# Patient Record
Sex: Male | Born: 1955 | Race: White | Hispanic: No | Marital: Married | Smoking: Never smoker
Health system: Southern US, Community
[De-identification: ages and names within clinical notes are randomized; demographics above are authoritative.]

## PROBLEM LIST (undated history)

## (undated) DIAGNOSIS — N400 Enlarged prostate without lower urinary tract symptoms: Secondary | ICD-10-CM

## (undated) DIAGNOSIS — E7800 Pure hypercholesterolemia, unspecified: Secondary | ICD-10-CM

## (undated) DIAGNOSIS — D580 Hereditary spherocytosis: Secondary | ICD-10-CM

## (undated) DIAGNOSIS — C4491 Basal cell carcinoma of skin, unspecified: Secondary | ICD-10-CM

## (undated) DIAGNOSIS — Z889 Allergy status to unspecified drugs, medicaments and biological substances status: Secondary | ICD-10-CM

## (undated) DIAGNOSIS — Z9081 Acquired absence of spleen: Secondary | ICD-10-CM

## (undated) DIAGNOSIS — I2699 Other pulmonary embolism without acute cor pulmonale: Secondary | ICD-10-CM

## (undated) HISTORY — PX: SPLENECTOMY: SUR1306

## (undated) HISTORY — PX: TONSILLECTOMY: SUR1361

## (undated) HISTORY — PX: MASTECTOMY: SHX3

---

## 1988-02-02 HISTORY — PX: APPENDECTOMY: SHX54

## 2007-10-14 HISTORY — PX: COLONOSCOPY: SHX174

## 2008-12-04 DIAGNOSIS — I2699 Other pulmonary embolism without acute cor pulmonale: Secondary | ICD-10-CM

## 2008-12-04 HISTORY — DX: Other pulmonary embolism without acute cor pulmonale: I26.99

## 2016-03-24 DIAGNOSIS — E7800 Pure hypercholesterolemia, unspecified: Secondary | ICD-10-CM | POA: Insufficient documentation

## 2016-03-24 DIAGNOSIS — Z86711 Personal history of pulmonary embolism: Secondary | ICD-10-CM | POA: Insufficient documentation

## 2016-03-26 DIAGNOSIS — D580 Hereditary spherocytosis: Secondary | ICD-10-CM | POA: Insufficient documentation

## 2016-03-26 DIAGNOSIS — Z9229 Personal history of other drug therapy: Secondary | ICD-10-CM | POA: Insufficient documentation

## 2016-03-26 DIAGNOSIS — K635 Polyp of colon: Secondary | ICD-10-CM | POA: Insufficient documentation

## 2017-06-12 ENCOUNTER — Encounter: Payer: Self-pay | Admitting: Emergency Medicine

## 2017-06-12 DIAGNOSIS — Y939 Activity, unspecified: Secondary | ICD-10-CM | POA: Insufficient documentation

## 2017-06-12 DIAGNOSIS — M7012 Bursitis, left hand: Secondary | ICD-10-CM | POA: Insufficient documentation

## 2017-06-12 DIAGNOSIS — M25532 Pain in left wrist: Secondary | ICD-10-CM | POA: Diagnosis present

## 2017-06-12 DIAGNOSIS — Z7901 Long term (current) use of anticoagulants: Secondary | ICD-10-CM | POA: Diagnosis not present

## 2017-06-12 DIAGNOSIS — Z791 Long term (current) use of non-steroidal anti-inflammatories (NSAID): Secondary | ICD-10-CM | POA: Diagnosis not present

## 2017-06-12 DIAGNOSIS — F17210 Nicotine dependence, cigarettes, uncomplicated: Secondary | ICD-10-CM | POA: Diagnosis not present

## 2017-06-12 DIAGNOSIS — Z79891 Long term (current) use of opiate analgesic: Secondary | ICD-10-CM | POA: Diagnosis not present

## 2017-06-12 NOTE — ED Triage Notes (Signed)
Pt arrived with complaints of left wrist pain that began suddenly at 1630. Pt reports no known injury and states the pain feels like "its broken." No visible deformity to arm and pt is able to move hand and fingers. Pt reports increased pain with movement. Pulses are present and equal.

## 2017-06-13 ENCOUNTER — Emergency Department
Admission: EM | Admit: 2017-06-13 | Discharge: 2017-06-13 | Disposition: A | Discharge status: Home / Self Care | Attending: Emergency Medicine | Admitting: Emergency Medicine

## 2017-06-13 ENCOUNTER — Emergency Department

## 2017-06-13 DIAGNOSIS — M25532 Pain in left wrist: Secondary | ICD-10-CM

## 2017-06-13 DIAGNOSIS — M7012 Bursitis, left hand: Secondary | ICD-10-CM

## 2017-06-13 HISTORY — DX: Other pulmonary embolism without acute cor pulmonale: I26.99

## 2017-06-13 MED ORDER — HYDROCODONE-ACETAMINOPHEN 5-325 MG PO TABS: 1.0000 | ORAL_TABLET | Freq: Four times a day (QID) | ORAL | 0 refills | Status: DC | PRN

## 2017-06-13 MED ORDER — IBUPROFEN 600 MG PO TABS: 600.0000 mg | ORAL_TABLET | Freq: Two times a day (BID) | ORAL | 0 refills | Status: DC | PRN

## 2017-06-13 MED ORDER — KETOROLAC TROMETHAMINE 30 MG/ML IJ SOLN
30.0000 mg | Freq: Once | INTRAMUSCULAR | Status: AC
  Administered 2017-06-13: 30 mg via INTRAMUSCULAR
  Filled 2017-06-13: qty 1

## 2017-06-13 NOTE — Discharge Instructions (Signed)
1. You may take ibuprofen sparingly, twice daily as needed for 3 days. Take Norco as needed for more severe pain. 2. Continue ice packs several times daily. 3. You may remove splint and sling as needed. 4. Return to the ER for worsening symptoms, increased swelling, numbness/tingling, fever, redness or other concerns.

## 2017-06-13 NOTE — ED Notes (Signed)
Pt states recently more active with upper body, denies injury, reports increased tenderness to left wrist

## 2017-06-13 NOTE — ED Provider Notes (Signed)
[facility] Emergency Department Provider Note   ____________________________________________   First MD Initiated Contact with Patient 06/13/17 0143     (approximate)  I have reviewed the triage vital signs and the nursing notes.   HISTORY  Chief Complaint Wrist Pain    HPI Sean Harvey is a 61 y.o. male who presents to the ED from home with a chief complaint of nontraumatic left wrist pain. Patient is left-handed and noted pain to his left wrist at approximately 4:30 PM. Patient reports he is quite active with biking, kayaking and canoeing. Wifestates he has been overexerting himself recently with leisurely hobbies as well as at work. Patient reports pain to ulnar styloid which is exacerbated with movements. Denies history of gout. Denies fever, chills, chest pain, shortness of breath, abdominal pain, nausea, vomiting. Denies extremity weakness, numbness or tingling. Denies recent travel or trauma. Patient takes Xarelto for PE.   Past Medical History:  Diagnosis Date  . Pulmonary emboli ([place]) 2010    There are no active problems to display for this patient.   Past Surgical History:  Procedure Laterality Date  . MASTECTOMY    . SPLENECTOMY      Prior to Admission medications   Medication Sig Start Date End Date Taking? Authorizing Provider  HYDROcodone-acetaminophen (NORCO) 5-325 MG tablet Take 1 tablet by mouth every 6 (six) hours as needed for moderate pain. 06/13/17   Paulette Blanch, MD  ibuprofen (ADVIL,MOTRIN) 600 MG tablet Take 1 tablet (600 mg total) by mouth 2 (two) times daily as needed. 06/13/17   Paulette Blanch, MD    Allergies Patient has no allergy information on record.  No family history on file.  Social History Social History  Substance Use Topics  . Smoking status: Current Every Day Smoker  . Smokeless tobacco: Former Systems developer    Types: Chew  . Alcohol use Yes    Review of Systems  Constitutional: No fever/chills. Eyes:  No visual changes. ENT: No sore throat. Cardiovascular: Denies chest pain. Respiratory: Denies shortness of breath. Gastrointestinal: No abdominal pain.  No nausea, no vomiting.  No diarrhea.  No constipation. Genitourinary: Negative for dysuria. Musculoskeletal: Positive for left wrist pain. Negative for back pain. Skin: Negative for rash. Neurological: Negative for headaches, focal weakness or numbness.   ____________________________________________   PHYSICAL EXAM:  VITAL SIGNS: ED Triage Vitals  Enc Vitals Group     BP 06/12/17 2346 (!) 145/72     Pulse Rate 06/12/17 2346 (!) 56     Resp 06/12/17 2346 16     Temp 06/12/17 2346 98.5 F (36.9 C)     Temp Source 06/12/17 2346 Oral     SpO2 06/12/17 2346 100 %     Weight 06/12/17 2347 130 lb (59 kg)     Height 06/12/17 2347 5\' 6"  (1.676 m)     Head Circumference --      Peak Flow --      Pain Score 06/12/17 2346 10     Pain Loc --      Pain Edu? --      Excl. in [place]? --     Constitutional: Alert and oriented. Well appearing and in no acute distress. Eyes: Conjunctivae are normal. PERRL. EOMI. Head: Atraumatic. Nose: No congestion/rhinnorhea. Mouth/Throat: Mucous membranes are moist.  Oropharynx non-erythematous. Neck: No stridor.  No cervical spine tenderness to palpation. Cardiovascular: Normal rate, regular rhythm. Grossly normal heart sounds.  Good peripheral circulation. Respiratory: Normal respiratory effort.  No retractions. Lungs CTAB. Gastrointestinal: Soft and nontender. No distention. No abdominal bruits. No CVA tenderness. Musculoskeletal:  Left wrist: No obvious deformity or injury. Tender to palpation along the ulnar styloid with mild swelling. There is no surrounding warmth or erythema. Full range of motion with minimal discomfort. Negative Tinel's. 2+ radial pulses. Brisk, less than 5 second capillary refill. Ring on fourth finger which is loose. Neurologic:  Normal speech and language. No gross focal  neurologic deficits are appreciated. No gait instability. Skin:  Skin is warm, dry and intact. No rash noted. Psychiatric: Mood and affect are normal. Speech and behavior are normal.  ____________________________________________   LABS (all labs ordered are listed, but only abnormal results are displayed)  Labs Reviewed - No data to display ____________________________________________  EKG  None ____________________________________________  RADIOLOGY  Dg Wrist Complete Left  Result Date: 06/13/2017 CLINICAL DATA:  61 year old male with left wrist pain. EXAM: LEFT WRIST - COMPLETE 3+ VIEW COMPARISON:  None. FINDINGS: There is no evidence of fracture or dislocation. There is no evidence of arthropathy or other focal bone abnormality. Soft tissues are unremarkable. IMPRESSION: Negative. Electronically Signed   By: Anner Crete M.D.   On: 06/13/2017 00:29    ____________________________________________   PROCEDURES  Procedure(s) performed: None  Procedures  Critical Care performed: No  ____________________________________________   INITIAL IMPRESSION / ASSESSMENT AND PLAN / ED COURSE  Pertinent labs & imaging results that were available during my care of the patient were reviewed by me and considered in my medical decision making (see chart for details).  61 year old male who presents with nontraumatic left wrist pain. Most likely bursitis secondary to overuse. There is no surrounding warmth or erythema to suggest gout or septic joint. Applying ice alone and has alleviated most of patient's pain. I advised NSAID sparingly since patient is on Xarelto. Prescription for Norco to use as needed for or severe pain. Velcro wrist splint and sling provided and patient will follow-up with orthopedics as needed. Strict return precautions given. Patient and spouse verbalize understanding and agree with plan of care.      ____________________________________________   FINAL  CLINICAL IMPRESSION(S) / ED DIAGNOSES  Final diagnoses:  Acute pain of left wrist  Bursitis of left wrist      NEW MEDICATIONS STARTED DURING THIS VISIT:  Discharge Medication List as of 06/13/2017  2:03 AM    START taking these medications   Details  HYDROcodone-acetaminophen (NORCO) 5-325 MG tablet Take 1 tablet by mouth every 6 (six) hours as needed for moderate pain., Starting Wed 06/13/2017, Print    ibuprofen (ADVIL,MOTRIN) 600 MG tablet Take 1 tablet (600 mg total) by mouth 2 (two) times daily as needed., Starting Wed 06/13/2017, Print         Note:  This document was prepared using Dragon voice recognition software and may include unintentional dictation errors.    Paulette Blanch, MD 06/13/17 [phone]

## 2017-06-13 NOTE — ED Notes (Signed)

## 2018-09-30 ENCOUNTER — Encounter: Payer: Self-pay | Admitting: *Deleted

## 2018-10-01 ENCOUNTER — Encounter: Payer: Self-pay | Admitting: *Deleted

## 2018-10-01 ENCOUNTER — Ambulatory Visit
Admission: RE | Admit: 2018-10-01 | Discharge: 2018-10-01 | Disposition: A | Discharge status: Home / Self Care | Source: Ambulatory Visit | Attending: Internal Medicine | Admitting: Internal Medicine

## 2018-10-01 ENCOUNTER — Ambulatory Visit: Admitting: Anesthesiology

## 2018-10-01 ENCOUNTER — Encounter
Admission: RE | Disposition: A | Discharge status: Home / Self Care | Payer: Self-pay | Source: Ambulatory Visit | Attending: Internal Medicine

## 2018-10-01 DIAGNOSIS — N400 Enlarged prostate without lower urinary tract symptoms: Secondary | ICD-10-CM | POA: Insufficient documentation

## 2018-10-01 DIAGNOSIS — Z7901 Long term (current) use of anticoagulants: Secondary | ICD-10-CM | POA: Diagnosis not present

## 2018-10-01 DIAGNOSIS — Z86010 Personal history of colonic polyps: Secondary | ICD-10-CM | POA: Diagnosis not present

## 2018-10-01 DIAGNOSIS — Z1211 Encounter for screening for malignant neoplasm of colon: Secondary | ICD-10-CM | POA: Diagnosis present

## 2018-10-01 DIAGNOSIS — K640 First degree hemorrhoids: Secondary | ICD-10-CM | POA: Diagnosis not present

## 2018-10-01 DIAGNOSIS — Z79899 Other long term (current) drug therapy: Secondary | ICD-10-CM | POA: Diagnosis not present

## 2018-10-01 DIAGNOSIS — Z800 Family history of malignant neoplasm of digestive organs: Secondary | ICD-10-CM | POA: Diagnosis not present

## 2018-10-01 DIAGNOSIS — Z86711 Personal history of pulmonary embolism: Secondary | ICD-10-CM | POA: Diagnosis not present

## 2018-10-01 DIAGNOSIS — Z85828 Personal history of other malignant neoplasm of skin: Secondary | ICD-10-CM | POA: Insufficient documentation

## 2018-10-01 DIAGNOSIS — R1312 Dysphagia, oropharyngeal phase: Secondary | ICD-10-CM | POA: Insufficient documentation

## 2018-10-01 DIAGNOSIS — K5730 Diverticulosis of large intestine without perforation or abscess without bleeding: Secondary | ICD-10-CM | POA: Diagnosis not present

## 2018-10-01 DIAGNOSIS — E7800 Pure hypercholesterolemia, unspecified: Secondary | ICD-10-CM | POA: Diagnosis not present

## 2018-10-01 HISTORY — DX: Allergy status to unspecified drugs, medicaments and biological substances: Z88.9

## 2018-10-01 HISTORY — DX: Acquired absence of spleen: Z90.81

## 2018-10-01 HISTORY — PX: ESOPHAGOGASTRODUODENOSCOPY (EGD) WITH PROPOFOL: SHX5813

## 2018-10-01 HISTORY — DX: Pure hypercholesterolemia, unspecified: E78.00

## 2018-10-01 HISTORY — DX: Hereditary spherocytosis: D58.0

## 2018-10-01 HISTORY — DX: Benign prostatic hyperplasia without lower urinary tract symptoms: N40.0

## 2018-10-01 HISTORY — DX: Basal cell carcinoma of skin, unspecified: C44.91

## 2018-10-01 HISTORY — PX: COLONOSCOPY WITH PROPOFOL: SHX5780

## 2018-10-01 SURGERY — COLONOSCOPY WITH PROPOFOL
Anesthesia: General

## 2018-10-01 MED ORDER — PHENYLEPHRINE HCL 10 MG/ML IJ SOLN
INTRAMUSCULAR | Status: DC | PRN
  Administered 2018-10-01: 100 ug via INTRAVENOUS

## 2018-10-01 MED ORDER — PROPOFOL 10 MG/ML IV BOLUS
INTRAVENOUS | Status: AC
  Filled 2018-10-01: qty 20

## 2018-10-01 MED ORDER — GLYCOPYRROLATE 0.2 MG/ML IJ SOLN
INTRAMUSCULAR | Status: AC
  Filled 2018-10-01: qty 1

## 2018-10-01 MED ORDER — FENTANYL CITRATE (PF) 100 MCG/2ML IJ SOLN
INTRAMUSCULAR | Status: AC
  Filled 2018-10-01: qty 2

## 2018-10-01 MED ORDER — PROPOFOL 10 MG/ML IV BOLUS
INTRAVENOUS | Status: DC | PRN
  Administered 2018-10-01 (×2): 40 mg via INTRAVENOUS

## 2018-10-01 MED ORDER — PROPOFOL 500 MG/50ML IV EMUL
INTRAVENOUS | Status: AC
  Filled 2018-10-01: qty 50

## 2018-10-01 MED ORDER — FENTANYL CITRATE (PF) 100 MCG/2ML IJ SOLN
INTRAMUSCULAR | Status: DC | PRN
  Administered 2018-10-01: 50 ug via INTRAVENOUS

## 2018-10-01 MED ORDER — MIDAZOLAM HCL 2 MG/2ML IJ SOLN
INTRAMUSCULAR | Status: AC
  Filled 2018-10-01: qty 2

## 2018-10-01 MED ORDER — GLYCOPYRROLATE 0.2 MG/ML IJ SOLN
INTRAMUSCULAR | Status: DC | PRN
  Administered 2018-10-01: 0.2 mg via INTRAVENOUS

## 2018-10-01 MED ORDER — IPRATROPIUM-ALBUTEROL 0.5-2.5 (3) MG/3ML IN SOLN: 3.0000 mL | Freq: Once | RESPIRATORY_TRACT | Status: DC

## 2018-10-01 MED ORDER — PROPOFOL 500 MG/50ML IV EMUL
INTRAVENOUS | Status: DC | PRN
  Administered 2018-10-01: 140 ug/kg/min via INTRAVENOUS

## 2018-10-01 MED ORDER — SODIUM CHLORIDE 0.9 % IV SOLN
INTRAVENOUS | Status: DC
  Administered 2018-10-01: 12:00:00 via INTRAVENOUS

## 2018-10-01 MED ORDER — MIDAZOLAM HCL 2 MG/2ML IJ SOLN
INTRAMUSCULAR | Status: DC | PRN
  Administered 2018-10-01: 1 mg via INTRAVENOUS

## 2018-10-01 MED ORDER — EPHEDRINE SULFATE 50 MG/ML IJ SOLN
INTRAMUSCULAR | Status: DC | PRN
  Administered 2018-10-01: 10 mg via INTRAVENOUS

## 2018-10-01 NOTE — Anesthesia Preprocedure Evaluation (Signed)
Anesthesia Evaluation  Patient identified by MRN, date of birth, ID band Patient awake    Reviewed: Allergy & Precautions, H&P , NPO status , Patient's Chart, lab work & pertinent test results  History of Anesthesia Complications Negative for: history of anesthetic complications  Airway Mallampati: II  TM Distance: >3 FB Neck ROM: full    Dental  (+) Chipped   Pulmonary neg pulmonary ROS, neg shortness of breath,           Cardiovascular Exercise Tolerance: Good (-) angina(-) Past MI and (-) DOE negative cardio ROS       Neuro/Psych negative neurological ROS  negative psych ROS   GI/Hepatic negative GI ROS, Neg liver ROS,   Endo/Other  negative endocrine ROS  Renal/GU negative Renal ROS  negative genitourinary   Musculoskeletal   Abdominal   Peds  Hematology negative hematology ROS (+)   Anesthesia Other Findings Past Medical History: No date: Acquired asplenia No date: Allergic genetic state No date: Basal cell carcinoma No date: BPH (benign prostatic hyperplasia) No date: Hypercholesterolemia 2010: Pulmonary emboli ([facility]) No date: Spherocytosis, hereditary [facility])  Past Surgical History: 02/1988: APPENDECTOMY 10/14/2007: COLONOSCOPY No date: MASTECTOMY No date: SPLENECTOMY No date: TONSILLECTOMY  BMI    Body Mass Index:  20.98 kg/m      Reproductive/Obstetrics negative OB ROS                             Anesthesia Physical Anesthesia Plan  ASA: II  Anesthesia Plan: General   Post-op Pain Management:    Induction: Intravenous  PONV Risk Score and Plan: Propofol infusion and TIVA  Airway Management Planned: Natural Airway and Nasal Cannula  Additional Equipment:   Intra-op Plan:   Post-operative Plan:   Informed Consent: I have reviewed the patients History and Physical, chart, labs and discussed the procedure including the risks, benefits and alternatives  for the proposed anesthesia with the patient or authorized representative who has indicated his/her understanding and acceptance.   Dental Advisory Given  Plan Discussed with: Anesthesiologist, CRNA and Surgeon  Anesthesia Plan Comments: (Patient consented for risks of anesthesia including but not limited to:  - adverse reactions to medications - risk of intubation if required - damage to teeth, lips or other oral mucosa - sore throat or hoarseness - Damage to heart, brain, lungs or loss of life  Patient voiced understanding.)        Anesthesia Quick Evaluation

## 2018-10-01 NOTE — Transfer of Care (Signed)
Immediate Anesthesia Transfer of Care Note  Patient: Sean Harvey  Procedure(s) Performed: COLONOSCOPY WITH PROPOFOL (N/A ) ESOPHAGOGASTRODUODENOSCOPY (EGD) WITH PROPOFOL (N/A )  Patient Location: PACU  Anesthesia Type:General  Level of Consciousness: sedated  Airway & Oxygen Therapy: Patient Spontanous Breathing and Patient connected to nasal cannula oxygen  Post-op Assessment: Report given to RN and Post -op Vital signs reviewed and stable  Post vital signs: Reviewed and stable  Last Vitals:  Vitals Value Taken Time  BP 87/56 10/01/2018  1:06 PM  Temp 36.1 C 10/01/2018  1:05 PM  Pulse 71 10/01/2018  1:07 PM  Resp 9 10/01/2018  1:07 PM  SpO2 100 % 10/01/2018  1:07 PM  Vitals shown include unvalidated device data.  Last Pain:  Vitals:   10/01/18 1305  TempSrc: Tympanic  PainSc: 0-No pain         Complications: No apparent anesthesia complications

## 2018-10-01 NOTE — Anesthesia Postprocedure Evaluation (Signed)
Anesthesia Post Note  Patient: Sean Harvey  Procedure(s) Performed: COLONOSCOPY WITH PROPOFOL (N/A ) ESOPHAGOGASTRODUODENOSCOPY (EGD) WITH PROPOFOL (N/A )  Patient location during evaluation: Endoscopy Anesthesia Type: General Level of consciousness: awake and alert Pain management: pain level controlled Vital Signs Assessment: post-procedure vital signs reviewed and stable Respiratory status: spontaneous breathing, nonlabored ventilation, respiratory function stable and patient connected to nasal cannula oxygen Cardiovascular status: blood pressure returned to baseline and stable Postop Assessment: no apparent nausea or vomiting Anesthetic complications: no     Last Vitals:  Vitals:   10/01/18 1305 10/01/18 1309  BP:  (!) 94/55  Pulse: 74   Resp: 16   Temp: (!) 36.1 C   SpO2: 100%     Last Pain:  Vitals:   10/01/18 1347  TempSrc:   PainSc: 0-No pain                 Precious Haws Reubin Bushnell

## 2018-10-01 NOTE — H&P (Signed)
Outpatient short stay form Pre-procedure 10/01/2018 12:11 PM Sean Harvey. Sean Harvey, M.D.  Primary Physician: Harrel Lemon, M.D.  Reason for visit:  Dysphagia, personal hx of colon polyps, family history of colon cancer.  History of present illness:  62 y/o male presents for one episode of oropharyngeal dysphagia while eating bread. Has not happened since. Also has personal hx of adenomatous colon polyps in 2008 and 2014 colonoscopies. Patient denies change in bowel habits, rectal bleeding, weight loss or abdominal pain. r Current Facility-Administered Medications:  .  0.9 %  sodium chloride infusion, , Intravenous, Continuous, Vernal Hritz, Waldron K, MD .  ipratropium-albuterol (DUONEB) 0.5-2.5 (3) MG/3ML nebulizer solution 3 mL, 3 mL, Nebulization, Once, Piscitello, Precious Haws, MD  Medications Prior to Admission  Medication Sig Dispense Refill Last Dose  . HYDROcodone-acetaminophen (NORCO) 5-325 MG tablet Take 1 tablet by mouth every 6 (six) hours as needed for moderate pain. 15 tablet 0 09/30/2018 at Unknown time  . hydrocortisone (ANUSOL-HC) 2.5 % rectal cream Place 1 application rectally 3 (three) times daily.   Past Week at Unknown time  . pravastatin (PRAVACHOL) 40 MG tablet Take 40 mg by mouth daily.   09/30/2018 at Unknown time  . rivaroxaban (XARELTO) 20 MG TABS tablet Take 20 mg by mouth daily.   Past Week at Unknown time  . VITAMINS A C E-ZN PO Take by mouth daily.   Past Week at Unknown time  . ibuprofen (ADVIL,MOTRIN) 600 MG tablet Take 1 tablet (600 mg total) by mouth 2 (two) times daily as needed. 8 tablet 0  at prn  . tamsulosin (FLOMAX) 0.4 MG CAPS capsule Take 0.4 mg by mouth daily.   Not Taking at Unknown time     Not on File   Past Medical History:  Diagnosis Date  . Acquired asplenia   . Allergic genetic state   . Basal cell carcinoma   . BPH (benign prostatic hyperplasia)   . Hypercholesterolemia   . Pulmonary emboli ([place]) 2010  . Spherocytosis, hereditary ([place])      Review of systems:  Otherwise negative.    Physical Exam  Gen: Alert, oriented. Appears stated age.  HEENT: [place]/AT. PERRLA. Lungs: CTA, no wheezes. CV: RR nl S1, S2. Abd: soft, benign, no masses. BS+ Ext: No edema. Pulses 2+    Planned procedures: Proceed with EGD and  colonoscopy. The patient understands the nature of the planned procedure, indications, risks, alternatives and potential complications including but not limited to bleeding, infection, perforation, damage to internal organs and possible oversedation/side effects from anesthesia. The patient agrees and gives consent to proceed.  Please refer to procedure notes for findings, recommendations and patient disposition/instructions.     Sean Harvey. Sean Harvey, M.D. Gastroenterology 10/01/2018  12:11 PM

## 2018-10-01 NOTE — Op Note (Addendum)
[facility] Gastroenterology Patient Name: Sean Harvey Procedure Date: 10/01/2018 11:52 AM MRN: [id number] Account #: [id number] Date of Birth: 24-Sep-1956 Admit Type: Outpatient Age: 62 Room: [facility] ENDO ROOM 3 Gender: Male Note Status: Finalized Procedure:            Colonoscopy Indications:          Screening in patient at increased risk: Family history                        of 1st-degree relative with colorectal cancer, High                        risk colon cancer surveillance: Personal history of                        colonic polyps Providers:            Benay Pike. Kaitlyn Skowron MD, MD Medicines:            Propofol per Anesthesia Complications:        No immediate complications. Procedure:            Pre-Anesthesia Assessment:                       - The risks and benefits of the procedure and the                        sedation options and risks were discussed with the                        patient. All questions were answered and informed                        consent was obtained.                       - Patient identification and proposed procedure were                        verified prior to the procedure by the nurse. The                        procedure was verified in the procedure room.                       - ASA Grade Assessment: III - A patient with severe                        systemic disease.                       - After reviewing the risks and benefits, the patient                        was deemed in satisfactory condition to undergo the                        procedure.                       After obtaining informed consent, the colonoscope was  passed under direct vision. Throughout the procedure,                        the patient's blood pressure, pulse, and oxygen                        saturations were monitored continuously. The                        Colonoscope was introduced through the anus and               advanced to the the cecum, identified by appendiceal                        orifice and ileocecal valve. The colonoscopy was                        performed without difficulty. The patient tolerated the                        procedure well. The quality of the bowel preparation                        was excellent. Findings:      The perianal and digital rectal examinations were normal. Pertinent       negatives include normal sphincter tone and no palpable rectal lesions.      A few small-mouthed diverticula were found in the sigmoid colon.      Non-bleeding internal hemorrhoids were found during retroflexion. The       hemorrhoids were Grade I (internal hemorrhoids that do not prolapse).      The exam was otherwise without abnormality. Impression:           - Diverticulosis in the sigmoid colon.                       - Non-bleeding internal hemorrhoids.                       - The examination was otherwise normal.                       - No specimens collected. Recommendation:       - Patient has a contact number available for                        emergencies. The signs and symptoms of potential                        delayed complications were discussed with the patient.                        Return to normal activities tomorrow. Written discharge                        instructions were provided to the patient.                       - Resume previous diet.                       - Continue  present medications.                       - Repeat colonoscopy in 5 years for screening purposes.                       - Return to my office PRN.                       - The findings and recommendations were discussed with                        the patient and their family. Procedure Code(s):    --- Professional ---                       P5916, Colorectal cancer screening; colonoscopy on                        individual at high risk Diagnosis Code(s):    --- Professional ---                        K57.30, Diverticulosis of large intestine without                        perforation or abscess without bleeding                       K64.0, First degree hemorrhoids                       Z86.010, Personal history of colonic polyps                       Z80.0, Family history of malignant neoplasm of                        digestive organs CPT copyright 2018 American Medical Association. All rights reserved. The codes documented in this report are preliminary and upon coder review may  be revised to meet current compliance requirements. Efrain Sella MD, MD 10/01/2018 1:09:55 PM This report has been signed electronically. Number of Addenda: 0 Note Initiated On: 10/01/2018 11:52 AM Scope Withdrawal Time: 0 hours 5 minutes 59 seconds  Total Procedure Duration: 0 hours 10 minutes 42 seconds       [facility]

## 2018-10-01 NOTE — Anesthesia Post-op Follow-up Note (Signed)
Anesthesia QCDR form completed.        

## 2018-10-01 NOTE — Interval H&P Note (Signed)
History and Physical Interval Note:  10/01/2018 12:14 PM  Sean Harvey  has presented today for surgery, with the diagnosis of personal hx colon polyps pharyngeal dysphagia  The various methods of treatment have been discussed with the patient and family. After consideration of risks, benefits and other options for treatment, the patient has consented to  Procedure(s): COLONOSCOPY WITH PROPOFOL (N/A) ESOPHAGOGASTRODUODENOSCOPY (EGD) WITH PROPOFOL (N/A) as a surgical intervention .  The patient's history has been reviewed, patient examined, no change in status, stable for surgery.  I have reviewed the patient's chart and labs.  Questions were answered to the patient's satisfaction.     [place], [place]

## 2018-10-01 NOTE — Anesthesia Procedure Notes (Signed)
Date/Time: 10/01/2018 12:15 PM Performed by: Allean Found, CRNA Pre-anesthesia Checklist: Patient identified, Emergency Drugs available, Suction available, Patient being monitored and Timeout performed Patient Re-evaluated:Patient Re-evaluated prior to induction Oxygen Delivery Method: Nasal cannula Placement Confirmation: positive ETCO2

## 2018-10-01 NOTE — Op Note (Signed)
[facility] Gastroenterology Patient Name: Sean Harvey Procedure Date: 10/01/2018 11:53 AM MRN: [id number] Account #: [id number] Date of Birth: 1956-11-29 Admit Type: Outpatient Age: 62 Room: [facility] ENDO ROOM 3 Gender: Male Note Status: Finalized Procedure:            Upper GI endoscopy Indications:          Dysphagia Providers:            Benay Pike. Alice Reichert MD, MD Referring MD:         No Local Md, MD (Referring MD) Medicines:            Propofol per Anesthesia Complications:        No immediate complications. Procedure:            Pre-Anesthesia Assessment:                       - Prior to the procedure, a History and Physical was                        performed, and patient medications, allergies and                        sensitivities were reviewed. The patient's tolerance of                        previous anesthesia was reviewed.                       After obtaining informed consent, the endoscope was                        passed under direct vision. Throughout the procedure,                        the patient's blood pressure, pulse, and oxygen                        saturations were monitored continuously. The Endoscope                        was introduced through the mouth, and advanced to the                        third part of duodenum. The upper GI endoscopy was                        accomplished without difficulty. The patient tolerated                        the procedure well. Findings:      The examined esophagus was normal.      The entire examined stomach was normal.      The examined duodenum was normal.      The exam was otherwise without abnormality. Impression:           - Normal esophagus.                       - Normal stomach.                       - Normal examined duodenum.                       -  The examination was otherwise normal.                       - No specimens collected. Recommendation:       - Proceed with  colonoscopy Procedure Code(s):    --- Professional ---                       [phone], Esophagogastroduodenoscopy, flexible, transoral;                        diagnostic, including collection of specimen(s) by                        brushing or washing, when performed (separate procedure) Diagnosis Code(s):    --- Professional ---                       R13.10, Dysphagia, unspecified CPT copyright 2018 American Medical Association. All rights reserved. The codes documented in this report are preliminary and upon coder review may  be revised to meet current compliance requirements. Efrain Sella MD, MD 10/01/2018 12:48:55 PM This report has been signed electronically. Number of Addenda: 0 Note Initiated On: 10/01/2018 11:53 AM      [facility]

## 2018-10-02 ENCOUNTER — Encounter: Payer: Self-pay | Admitting: Internal Medicine

## 2019-09-10 DIAGNOSIS — M1712 Unilateral primary osteoarthritis, left knee: Secondary | ICD-10-CM | POA: Insufficient documentation

## 2019-10-20 ENCOUNTER — Other Ambulatory Visit: Payer: Self-pay | Admitting: Internal Medicine

## 2019-10-20 DIAGNOSIS — R748 Abnormal levels of other serum enzymes: Secondary | ICD-10-CM

## 2019-10-23 ENCOUNTER — Other Ambulatory Visit: Payer: Self-pay

## 2019-10-23 ENCOUNTER — Ambulatory Visit
Admission: RE | Admit: 2019-10-23 | Discharge: 2019-10-23 | Disposition: A | Discharge status: Home / Self Care | Source: Ambulatory Visit | Attending: Internal Medicine | Admitting: Internal Medicine

## 2019-10-23 DIAGNOSIS — R748 Abnormal levels of other serum enzymes: Secondary | ICD-10-CM

## 2020-03-18 ENCOUNTER — Ambulatory Visit (INDEPENDENT_AMBULATORY_CARE_PROVIDER_SITE_OTHER): Admitting: Dermatology

## 2020-03-18 ENCOUNTER — Encounter: Payer: Self-pay | Admitting: Dermatology

## 2020-03-18 ENCOUNTER — Other Ambulatory Visit: Payer: Self-pay

## 2020-03-18 DIAGNOSIS — L578 Other skin changes due to chronic exposure to nonionizing radiation: Secondary | ICD-10-CM

## 2020-03-18 DIAGNOSIS — L821 Other seborrheic keratosis: Secondary | ICD-10-CM | POA: Diagnosis not present

## 2020-03-18 DIAGNOSIS — Z872 Personal history of diseases of the skin and subcutaneous tissue: Secondary | ICD-10-CM | POA: Diagnosis not present

## 2020-03-18 NOTE — Progress Notes (Signed)
   Follow-Up Visit   Subjective  Sean Harvey is a 64 y.o. male who presents for the following: Follow-up (ISKs) and raised spot (Back of left shoulder, has been there for about 2 years, seems to be growing in size).  The following portions of the chart were reviewed this encounter and updated as appropriate: Tobacco  Allergies  Meds  Problems  Med Hx  Surg Hx  Fam Hx      Review of Systems: No other skin or systemic complaints.  Objective  Well appearing patient in no apparent distress; mood and affect are within normal limits.  A focused examination was performed including face, neck, chest and back. Relevant physical exam findings are noted in the Assessment and Plan.   Assessment & Plan    Actinic Damage - diffuse scaly erythematous macules with underlying dyspigmentation - Recommend daily broad spectrum sunscreen SPF 30+ to sun-exposed areas, reapply every 2 hours as needed.  - Call for new or changing lesions.  Seborrheic Keratoses - Stuck-on, waxy, tan-brown papules and plaques scattered at shoulders and scalp - Discussed benign etiology and prognosis. - Observe - Call for any changes  History of irritated seborrheic keratoses - Clear today after LN2 - Call for recurrence.  Return in about 6 months (around 09/17/2020) for FBSE.   I, Donzetta Kohut, CMA, am acting as scribe for Forest Gleason, MD .  Documentation: I have reviewed the above documentation for accuracy and completeness, and I agree with the above.  Forest Gleason, MD

## 2020-03-18 NOTE — Patient Instructions (Signed)
Recommend daily broad spectrum sunscreen SPF 30+ to sun-exposed areas, reapply every 2 hours as needed. Call for new or changing lesions.  Recommend taking Heliocare sun protection supplement daily in sunny weather for additional sun protection. For maximum protection on the sunniest days, you can take up to 2 capsules of regular Heliocare OR take 1 capsule of Heliocare Ultra. For prolonged exposure (such as a full day in the sun), you can repeat your dose of the supplement 4 hours after your first dose. Heliocare can be purchased at [employer], at some [facility] or at [email or web address].

## 2020-04-06 ENCOUNTER — Emergency Department (HOSPITAL_COMMUNITY)
Admission: EM | Admit: 2020-04-06 | Discharge: 2020-04-06 | Disposition: A | Discharge status: Home / Self Care | Attending: Emergency Medicine | Admitting: Emergency Medicine

## 2020-04-06 ENCOUNTER — Encounter (HOSPITAL_COMMUNITY): Payer: Self-pay

## 2020-04-06 ENCOUNTER — Emergency Department (HOSPITAL_COMMUNITY)

## 2020-04-06 ENCOUNTER — Other Ambulatory Visit: Payer: Self-pay

## 2020-04-06 DIAGNOSIS — Z79899 Other long term (current) drug therapy: Secondary | ICD-10-CM | POA: Insufficient documentation

## 2020-04-06 DIAGNOSIS — R791 Abnormal coagulation profile: Secondary | ICD-10-CM | POA: Diagnosis not present

## 2020-04-06 DIAGNOSIS — H538 Other visual disturbances: Secondary | ICD-10-CM

## 2020-04-06 LAB — COMPREHENSIVE METABOLIC PANEL
ALT: 42 U/L (ref 0–44)
AST: 36 U/L (ref 15–41)
Albumin: 3.8 g/dL (ref 3.5–5.0)
Alkaline Phosphatase: 63 U/L (ref 38–126)
Anion gap: 7 (ref 5–15)
BUN: 15 mg/dL (ref 8–23)
CO2: 26 mmol/L (ref 22–32)
Calcium: 9.6 mg/dL (ref 8.9–10.3)
Chloride: 104 mmol/L (ref 98–111)
Creatinine, Ser: 0.98 mg/dL (ref 0.61–1.24)
GFR calc Af Amer: >60 mL/min (ref >60)
GFR calc non Af Amer: >60 mL/min (ref >60)
Glucose, Bld: 103 mg/dL — ABNORMAL HIGH (ref 70–99)
Potassium: 4.3 mmol/L (ref 3.5–5.1)
Sodium: 137 mmol/L (ref 135–145)
Total Bilirubin: 0.7 mg/dL (ref 0.3–1.2)
Total Protein: 7.5 g/dL (ref 6.5–8.1)

## 2020-04-06 LAB — I-STAT CHEM 8, ED
BUN: 18 mg/dL (ref 8–23)
Calcium, Ion: 1.26 mmol/L (ref 1.15–1.40)
Chloride: 102 mmol/L (ref 98–111)
Creatinine, Ser: 0.8 mg/dL (ref 0.61–1.24)
Glucose, Bld: 97 mg/dL (ref 70–99)
HCT: 42 % (ref 39.0–52.0)
Hemoglobin: 14.3 g/dL (ref 13.0–17.0)
Potassium: 4.3 mmol/L (ref 3.5–5.1)
Sodium: 140 mmol/L (ref 135–145)
TCO2: 29 mmol/L (ref 22–32)

## 2020-04-06 LAB — CBC
HCT: 44.3 % (ref 39.0–52.0)
Hemoglobin: 15 g/dL (ref 13.0–17.0)
MCH: 33.2 pg (ref 26.0–34.0)
MCHC: 33.9 g/dL (ref 30.0–36.0)
MCV: 98 fL (ref 80.0–100.0)
Platelets: 390 10*3/uL (ref 150–400)
RBC: 4.52 MIL/uL (ref 4.22–5.81)
RDW: 13.1 % (ref 11.5–15.5)
WBC: 8.8 10*3/uL (ref 4.0–10.5)
nRBC: 0 % (ref 0.0–0.2)

## 2020-04-06 LAB — DIFFERENTIAL
Abs Immature Granulocytes: 0.04 10*3/uL (ref 0.00–0.07)
Basophils Absolute: 0.1 10*3/uL (ref 0.0–0.1)
Basophils Relative: 1 %
Eosinophils Absolute: 0.4 10*3/uL (ref 0.0–0.5)
Eosinophils Relative: 4 %
Immature Granulocytes: 1 %
Lymphocytes Relative: 23 %
Lymphs Abs: 2 10*3/uL (ref 0.7–4.0)
Monocytes Absolute: 1 10*3/uL (ref 0.1–1.0)
Monocytes Relative: 11 %
Neutro Abs: 5.3 10*3/uL (ref 1.7–7.7)
Neutrophils Relative %: 60 %

## 2020-04-06 LAB — APTT: aPTT: 33 seconds (ref 24–36)

## 2020-04-06 LAB — PROTIME-INR
INR: 1.2 (ref 0.8–1.2)
Prothrombin Time: 14.5 seconds (ref 11.4–15.2)

## 2020-04-06 LAB — CBG MONITORING, ED: Glucose-Capillary: 82 mg/dL (ref 70–99)

## 2020-04-06 MED ORDER — IOHEXOL 350 MG/ML SOLN
80.0000 mL | Freq: Once | INTRAVENOUS | Status: AC | PRN
  Administered 2020-04-06: 80 mL via INTRAVENOUS

## 2020-04-06 MED ORDER — SODIUM CHLORIDE 0.9% FLUSH: 3.0000 mL | Freq: Once | INTRAVENOUS | Status: DC

## 2020-04-06 NOTE — ED Provider Notes (Signed)
[facility] EMERGENCY DEPARTMENT Provider Note   CSN: [id number] Arrival date & time: 04/06/20  [id number]     History No chief complaint on file.   Sean Harvey is a 64 y.o. male.  HPI Patient presents with blurred vision.  Has had since Friday being Tuesday.  Initially it came and went but now more constant.  States it is more just trouble reading and vision is blurred.  Also feels little dizzy at times.  Feels little lightheaded.  No headache.  No confusion.  No chest pain.  Has not had episodes like this before.  Not better if either eyes closed.  He has a low heart rate but states he is athletic and rides his bike a lot.  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  No chest pain.    Past Medical History:  Diagnosis Date  . Acquired asplenia   . Allergic genetic state   . Basal cell carcinoma   . BPH (benign prostatic hyperplasia)   . Hypercholesterolemia   . Pulmonary emboli ([place]) 2010  . Spherocytosis, hereditary ([place])     There are no problems to display for this patient.   Past Surgical History:  Procedure Laterality Date  . APPENDECTOMY  02/1988  . COLONOSCOPY  10/14/2007  . COLONOSCOPY WITH PROPOFOL N/A 10/01/2018   Procedure: COLONOSCOPY WITH PROPOFOL;  Surgeon: Toledo, Benay Pike, MD;  Location: [facility] ENDOSCOPY;  Service: Gastroenterology;  Laterality: N/A;  . ESOPHAGOGASTRODUODENOSCOPY (EGD) WITH PROPOFOL N/A 10/01/2018   Procedure: ESOPHAGOGASTRODUODENOSCOPY (EGD) WITH PROPOFOL;  Surgeon: Toledo, Benay Pike, MD;  Location: [facility] ENDOSCOPY;  Service: Gastroenterology;  Laterality: N/A;  . MASTECTOMY    . SPLENECTOMY    . TONSILLECTOMY         Family History  Problem Relation Age of Onset  . Diabetes Mother   . Colon cancer Father   . Diabetes Father   . Prostate cancer Father   . Skin cancer Father     Social History   Tobacco Use  . Smoking status: Never Smoker  . Smokeless tobacco: Never Used   Substance Use Topics  . Alcohol use: Yes  . Drug use: No    Home Medications Prior to Admission medications   Medication Sig Start Date End Date Taking? Authorizing Provider  HYDROcodone-acetaminophen (NORCO) 5-325 MG tablet Take 1 tablet by mouth every 6 (six) hours as needed for moderate pain. 06/13/17   Paulette Blanch, MD  hydrocortisone (ANUSOL-HC) 2.5 % rectal cream Place 1 application rectally 3 (three) times daily.    [provider]  ibuprofen (ADVIL,MOTRIN) 600 MG tablet Take 1 tablet (600 mg total) by mouth 2 (two) times daily as needed. 06/13/17   Paulette Blanch, MD  pravastatin (PRAVACHOL) 40 MG tablet Take 40 mg by mouth daily.    [provider]  rivaroxaban (XARELTO) 20 MG TABS tablet Take 20 mg by mouth daily.    [provider]  tamsulosin (FLOMAX) 0.4 MG CAPS capsule Take 0.4 mg by mouth daily.    [provider]  VITAMINS A C E-ZN PO Take by mouth daily.    [provider]    Allergies    Patient has no known allergies.  Review of Systems   Review of Systems  Constitutional: Negative for appetite change.  HENT: Negative for congestion.   Eyes: Positive for visual disturbance.  Cardiovascular: Negative for chest pain.  Gastrointestinal: Negative for  abdominal distention.  Genitourinary: Negative for flank pain.  Musculoskeletal: Negative for back pain.  Neurological: Positive for dizziness and light-headedness. Negative for speech difficulty and weakness.  Psychiatric/Behavioral: Negative for confusion.    Physical Exam Updated Vital Signs BP 125/77   Pulse 60   Temp 98.3 F (36.8 C) (Oral)   Resp 18   Ht 5\' 6"  (1.676 m)   Wt 63.5 kg   SpO2 100%   BMI 22.60 kg/m   Physical Exam Vitals and nursing note reviewed.  Constitutional:      Appearance: Normal appearance.  HENT:     Head: Atraumatic.  Eyes:     General:        Right eye: No discharge.        Left eye: No discharge.     Extraocular Movements:  Extraocular movements intact.     Pupils: Pupils are equal, round, and reactive to light.  Cardiovascular:     Rate and Rhythm: Regular rhythm.  Musculoskeletal:     Cervical back: Neck supple.  Skin:    General: Skin is warm.     Capillary Refill: Capillary refill takes less than 2 seconds.  Neurological:     Mental Status: He is alert and oriented to person, place, and time.     Comments: Pupils reactive.  Visual fields intact.  Eye movements intact.  Normal speech.  Normal gait.  Normal strength bilateral upper and lower extremities.     ED Results / Procedures / Treatments   Labs (all labs ordered are listed, but only abnormal results are displayed) Labs Reviewed  COMPREHENSIVE METABOLIC PANEL - Abnormal; Notable for the following components:      Result Value   Glucose, Bld 103 (*)    All other components within normal limits  PROTIME-INR  APTT  CBC  DIFFERENTIAL  I-STAT CHEM 8, ED  CBG MONITORING, ED    EKG EKG Interpretation  Date/Time:  Tuesday Apr 06 2020 08:50:37 EDT Ventricular Rate:  51 PR Interval:  160 QRS Duration: 92 QT Interval:  412 QTC Calculation: 379 R Axis:   81 Text Interpretation: Sinus bradycardia Cannot rule out Anterior infarct , age undetermined Abnormal ECG Confirmed by Davonna Belling [phone]) on 04/06/2020 9:02:41 AM   Radiology CT Angio Head W or Wo Contrast  Result Date: 04/06/2020 CLINICAL DATA:  Focal neuro deficit, greater than 6 hours, stroke suspected. Additional history provided: Intermittent dizziness, blurred vision. EXAM: CT ANGIOGRAPHY HEAD AND NECK TECHNIQUE: Multidetector CT imaging of the head and neck was performed using the standard protocol during bolus administration of intravenous contrast. Multiplanar CT image reconstructions and MIPs were obtained to evaluate the vascular anatomy. Carotid stenosis measurements (when applicable) are obtained utilizing NASCET criteria, using the distal internal carotid diameter as the  denominator. CONTRAST:  63mL OMNIPAQUE IOHEXOL 350 MG/ML SOLN COMPARISON:  No pertinent prior studies available for comparison. FINDINGS: CT HEAD FINDINGS Brain: There is no acute intracranial hemorrhage. No demarcated cortical infarct. No extra-axial fluid collection. No evidence of intracranial mass. No midline shift. Vascular: Reported below. Skull: Normal. Negative for fracture or focal lesion. Sinuses: Mild ethmoid sinus mucosal thickening. No significant mastoid effusion. Orbits: No acute abnormality. Review of the MIP images confirms the above findings CTA NECK FINDINGS Aortic arch: Standard aortic branching. Minimal calcified plaque within the proximal right subclavian artery. No hemodynamically significant innominate or proximal subclavian artery stenosis. Right carotid system: CCA and ICA patent within the neck without measurable stenosis. Trace mixed plaque within the  carotid bulb. Left carotid system: CCA and ICA patent within the neck without measurable stenosis. Trace mixed plaque within the carotid bulb. Vertebral arteries: The vertebral arteries are patent within the neck bilaterally without measurable stenosis. The right vertebral artery is dominant. Skeleton: No acute bony abnormality. Cervical spondylosis with multilevel disc space narrowing, posterior disc osteophytes and uncovertebral hypertrophy. Other neck: No neck mass or cervical lymphadenopathy. Upper chest: No consolidation within the imaged lung apices. Review of the MIP images confirms the above findings CTA HEAD FINDINGS Anterior circulation: The intracranial internal carotid arteries are patent. Minimal calcified plaque within these vessels without significant stenosis. The M1 middle cerebral arteries are patent without significant stenosis. No M2 proximal branch occlusion or high-grade proximal stenosis is identified. The anterior cerebral arteries are patent without significant proximal stenosis. No intracranial aneurysm is  identified. Posterior circulation: The dominant intracranial right vertebral artery is patent without significant stenosis. The non dominant intracranial left vertebral artery is developmentally diminutive beyond the origin of the left PICA, although patent. Hypoplastic P1 right PCA segment with sizable right posterior communicating artery. The posterior cerebral is are patent proximally without significant stenosis. The left posterior communicating artery is hypoplastic or absent. Venous sinuses: Within limitations of contrast timing, no convincing thrombus. Anatomic variants: As described Review of the MIP images confirms the above findings IMPRESSION: CT head: Unremarkable CT appearance of the brain. No evidence of acute intracranial abnormality. CTA neck: The bilateral common carotid, internal carotid and vertebral arteries are patent within the neck without significant stenosis. CTA head: 1. No intracranial large vessel occlusion or proximal high-grade arterial stenosis. 2. Mild calcified plaque within the intracranial ICAs. Electronically Signed   By: Kellie Simmering DO   On: 04/06/2020 12:17   CT Angio Neck W and/or Wo Contrast  Result Date: 04/06/2020 CLINICAL DATA:  Focal neuro deficit, greater than 6 hours, stroke suspected. Additional history provided: Intermittent dizziness, blurred vision. EXAM: CT ANGIOGRAPHY HEAD AND NECK TECHNIQUE: Multidetector CT imaging of the head and neck was performed using the standard protocol during bolus administration of intravenous contrast. Multiplanar CT image reconstructions and MIPs were obtained to evaluate the vascular anatomy. Carotid stenosis measurements (when applicable) are obtained utilizing NASCET criteria, using the distal internal carotid diameter as the denominator. CONTRAST:  105mL OMNIPAQUE IOHEXOL 350 MG/ML SOLN COMPARISON:  No pertinent prior studies available for comparison. FINDINGS: CT HEAD FINDINGS Brain: There is no acute intracranial hemorrhage.  No demarcated cortical infarct. No extra-axial fluid collection. No evidence of intracranial mass. No midline shift. Vascular: Reported below. Skull: Normal. Negative for fracture or focal lesion. Sinuses: Mild ethmoid sinus mucosal thickening. No significant mastoid effusion. Orbits: No acute abnormality. Review of the MIP images confirms the above findings CTA NECK FINDINGS Aortic arch: Standard aortic branching. Minimal calcified plaque within the proximal right subclavian artery. No hemodynamically significant innominate or proximal subclavian artery stenosis. Right carotid system: CCA and ICA patent within the neck without measurable stenosis. Trace mixed plaque within the carotid bulb. Left carotid system: CCA and ICA patent within the neck without measurable stenosis. Trace mixed plaque within the carotid bulb. Vertebral arteries: The vertebral arteries are patent within the neck bilaterally without measurable stenosis. The right vertebral artery is dominant. Skeleton: No acute bony abnormality. Cervical spondylosis with multilevel disc space narrowing, posterior disc osteophytes and uncovertebral hypertrophy. Other neck: No neck mass or cervical lymphadenopathy. Upper chest: No consolidation within the imaged lung apices. Review of the MIP images confirms the above findings CTA HEAD  FINDINGS Anterior circulation: The intracranial internal carotid arteries are patent. Minimal calcified plaque within these vessels without significant stenosis. The M1 middle cerebral arteries are patent without significant stenosis. No M2 proximal branch occlusion or high-grade proximal stenosis is identified. The anterior cerebral arteries are patent without significant proximal stenosis. No intracranial aneurysm is identified. Posterior circulation: The dominant intracranial right vertebral artery is patent without significant stenosis. The non dominant intracranial left vertebral artery is developmentally diminutive beyond  the origin of the left PICA, although patent. Hypoplastic P1 right PCA segment with sizable right posterior communicating artery. The posterior cerebral is are patent proximally without significant stenosis. The left posterior communicating artery is hypoplastic or absent. Venous sinuses: Within limitations of contrast timing, no convincing thrombus. Anatomic variants: As described Review of the MIP images confirms the above findings IMPRESSION: CT head: Unremarkable CT appearance of the brain. No evidence of acute intracranial abnormality. CTA neck: The bilateral common carotid, internal carotid and vertebral arteries are patent within the neck without significant stenosis. CTA head: 1. No intracranial large vessel occlusion or proximal high-grade arterial stenosis. 2. Mild calcified plaque within the intracranial ICAs. Electronically Signed   By: Kellie Simmering DO   On: 04/06/2020 12:17    Procedures Procedures (including critical care time)  Medications Ordered in ED Medications  sodium chloride flush (NS) 0.9 % injection 3 mL (has no administration in time range)  iohexol (OMNIPAQUE) 350 MG/ML injection 80 mL (80 mLs Intravenous Contrast Given 04/06/20 1149)    ED Course  I have reviewed the triage vital signs and the nursing notes.  Pertinent labs & imaging results that were available during my care of the patient were reviewed by me and considered in my medical decision making (see chart for details).    MDM Rules/Calculators/A&P                      Patient presents with vision changes and dizziness.  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  Exam overall reassuring here.  Bilateral blurred vision with dizziness..  Head CT and CTA done and reassuring.  Lab work reassuring.  Only abnormality was glucose of 103.  I think stroke less likely.  With reassuring exam I think it is reasonable for outpatient follow-up.  Discharge home Final Clinical Impression(s) / ED Diagnoses Final diagnoses:   Blurred vision, bilateral    Rx / DC Orders ED Discharge Orders    None       Davonna Belling, MD 04/06/20 1314

## 2020-04-06 NOTE — ED Notes (Signed)
Pt d/c home per MD order. Discharge summary reviewed with pt, pt verbalizes understanding,. Off unit via WC. No s/s of acute distress noted. discharged home with significant other

## 2020-04-06 NOTE — Discharge Instructions (Addendum)
With your blurred vision and dizziness follow-up with neurology.  You also may need an eye evaluation.

## 2020-04-06 NOTE — ED Notes (Signed)
Patient transported to CT 

## 2020-04-06 NOTE — ED Triage Notes (Signed)
Patient complains of dizziness that he describes as intermittently with blurred vision that has worsened today, symptoms onset this past Friday, denies pain. Gait steady, no drift

## 2020-08-12 ENCOUNTER — Encounter: Admitting: Dermatology

## 2020-08-12 ENCOUNTER — Ambulatory Visit (INDEPENDENT_AMBULATORY_CARE_PROVIDER_SITE_OTHER): Admitting: Dermatology

## 2020-08-12 ENCOUNTER — Other Ambulatory Visit: Payer: Self-pay

## 2020-08-12 DIAGNOSIS — D229 Melanocytic nevi, unspecified: Secondary | ICD-10-CM

## 2020-08-12 DIAGNOSIS — L578 Other skin changes due to chronic exposure to nonionizing radiation: Secondary | ICD-10-CM

## 2020-08-12 DIAGNOSIS — D485 Neoplasm of uncertain behavior of skin: Secondary | ICD-10-CM | POA: Diagnosis not present

## 2020-08-12 DIAGNOSIS — L820 Inflamed seborrheic keratosis: Secondary | ICD-10-CM

## 2020-08-12 DIAGNOSIS — Z1283 Encounter for screening for malignant neoplasm of skin: Secondary | ICD-10-CM

## 2020-08-12 DIAGNOSIS — B353 Tinea pedis: Secondary | ICD-10-CM | POA: Diagnosis not present

## 2020-08-12 DIAGNOSIS — D1800 Hemangioma unspecified site: Secondary | ICD-10-CM | POA: Diagnosis not present

## 2020-08-12 DIAGNOSIS — L821 Other seborrheic keratosis: Secondary | ICD-10-CM

## 2020-08-12 DIAGNOSIS — Z85828 Personal history of other malignant neoplasm of skin: Secondary | ICD-10-CM

## 2020-08-12 DIAGNOSIS — L814 Other melanin hyperpigmentation: Secondary | ICD-10-CM

## 2020-08-12 HISTORY — DX: Melanocytic nevi, unspecified: D22.9

## 2020-08-12 HISTORY — DX: Neoplasm of uncertain behavior of skin: D48.5

## 2020-08-12 MED ORDER — CICLOPIROX OLAMINE 0.77 % EX CREA: TOPICAL_CREAM | Freq: Two times a day (BID) | CUTANEOUS | 0 refills | Status: AC

## 2020-08-12 NOTE — Patient Instructions (Addendum)
Melanoma ABCDEs  Melanoma is the most dangerous type of skin cancer, and is the leading cause of death from skin disease.  You are more likely to develop melanoma if you:  Have light-colored skin, light-colored eyes, or red or blond hair  Spend a lot of time in the sun  Tan regularly, either outdoors or in a tanning bed  Have had blistering sunburns, especially during childhood  Have a close family member who has had a melanoma  Have atypical moles or large birthmarks  Early detection of melanoma is key since treatment is typically straightforward and cure rates are extremely high if we catch it early.   The first sign of melanoma is often a change in a mole or a new dark spot.  The ABCDE system is a way of remembering the signs of melanoma.  A for asymmetry:  The two halves do not match. B for border:  The edges of the growth are irregular. C for color:  A mixture of colors are present instead of an even brown color. D for diameter:  Melanomas are usually (but not always) greater than 17mm - the size of a pencil eraser. E for evolution:  The spot keeps changing in size, shape, and color.  Please check your skin once per month between visits. You can use a small mirror in front and a large mirror behind you to keep an eye on the back side or your body.   If you see any new or changing lesions before your next follow-up, please call to schedule a visit.  Please continue daily skin protection including broad spectrum sunscreen SPF 30+ to sun-exposed areas, reapplying every 2 hours as needed when you're outdoors.   Wound Care Instructions  1. Cleanse wound gently with soap and water once a day then pat dry with clean gauze. Apply a thing coat of Petrolatum (petroleum jelly, "Vaseline") over the wound (unless you have an allergy to this). We recommend that you use a new, sterile tube of Vaseline. Do not pick or remove scabs. Do not remove the yellow or white "healing tissue" from the  base of the wound.  2. Cover the wound with fresh, clean, nonstick gauze and secure with paper tape. You may use Band-Aids in place of gauze and tape if the would is small enough, but would recommend trimming much of the tape off as there is often too much. Sometimes Band-Aids can irritate the skin.  3. You should call the office for your biopsy report after 1 week if you have not already been contacted.  4. If you experience any problems, such as abnormal amounts of bleeding, swelling, significant bruising, significant pain, or evidence of infection, please call the office immediately.  5. FOR ADULT SURGERY PATIENTS: If you need something for pain relief you may take 1 extra strength Tylenol (acetaminophen) AND 2 Ibuprofen (200mg  each) together every 4 hours as needed for pain. (do not take these if you are allergic to them or if you have a reason you should not take them.) Typically, you may only need pain medication for 1 to 3 days.    Cryotherapy Aftercare  . Wash gently with soap and water everyday.   Marland Kitchen Apply Vaseline and Band-Aid daily until healed.  Prior to procedure, discussed risks of blister formation, small wound, skin dyspigmentation, or rare scar following cryotherapy.   Recommend Nicotinamide 500mg  twice per day to lower risk of non-melanoma skin cancer by approximately 25%.   Recommend taking Heliocare  sun protection supplement daily in sunny weather for additional sun protection. For maximum protection on the sunniest days, you can take up to 2 capsules of regular Heliocare OR take 1 capsule of Heliocare Ultra. For prolonged exposure (such as a full day in the sun), you can repeat your dose of the supplement 4 hours after your first dose. Heliocare can be purchased at [facility] or at [email or web address].

## 2020-08-12 NOTE — Progress Notes (Signed)
en Follow-Up Visit   Subjective  Sean Harvey is a 64 y.o. male who presents for the following: FBSE (Patient here today for 6 month FBSE. He has a history of BCC.).  Patient has a spot on left cheek, present for at least 8 months that he is concerned about. He also has some spots that itch at the upper back. Patient also asked about pads that are used to treat athletes feet.  The following portions of the chart were reviewed this encounter and updated as appropriate:  Tobacco  Allergies  Meds  Problems  Med Hx  Surg Hx  Fam Hx      Review of Systems:  No other skin or systemic complaints except as noted in HPI or Assessment and Plan.  Objective  Well appearing patient in no apparent distress; mood and affect are within normal limits.  A full examination was performed including scalp, head, eyes, ears, nose, lips, neck, chest, axillae, abdomen, back, buttocks, bilateral upper extremities, bilateral lower extremities, hands, feet, fingers, toes, fingernails, and toenails. All findings within normal limits unless otherwise noted below.  Objective  Feet: Scaling and maceration web spaces and over distal and lateral soles.   Objective  Left Cheek: 0.5 cm slightly irregular brown macule      Objective  Left Shoulder x 2 (2): Erythematous keratotic or waxy stuck-on papule or plaque.    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 Currently flared.  Repeat treatment when needed.  ciclopirox (LOPROX) 0.77 % cream - Feet  Neoplasm of uncertain behavior of skin Left Cheek  Epidermal / dermal shaving  Lesion diameter (cm):  0.6 Informed consent: discussed and consent obtained   Timeout: patient name, date of birth, surgical site, and procedure verified   Patient was prepped and draped in usual sterile fashion: area prepped with isopropyl alcohol. Anesthesia: the lesion was anesthetized in a standard fashion   Anesthetic:  1% lidocaine w/  epinephrine 1-100,000 buffered w/ 8.4% NaHCO3 Instrument used: flexible razor blade   Hemostasis achieved with: aluminum chloride   Outcome: patient tolerated procedure well   Post-procedure details: wound care instructions given   Additional details:  Mupirocin and a bandage applied  Specimen 1 - Surgical pathology Differential Diagnosis: r/o SK vs Atypia Check Margins: No 0.4cm slightly irregular brown macule   Inflamed seborrheic keratosis (2) Left Shoulder x 2  Prior to procedure, discussed risks of blister formation, small wound, skin dyspigmentation, or rare scar following cryotherapy.    Destruction of lesion - Left Shoulder x 2 Complexity: simple   Destruction method: cryotherapy   Informed consent: discussed and consent obtained   Lesion destroyed using liquid nitrogen: Yes   Cryotherapy cycles:  2 Outcome: patient tolerated procedure well with no complications   Post-procedure details: wound care instructions given     Lentigines - Scattered tan macules - Discussed due to sun exposure - Benign, observe - Call for any changes  Seborrheic Keratoses - Stuck-on, waxy, tan-brown papules and plaques  - Discussed benign etiology and prognosis. - Observe - Call for any changes  Melanocytic Nevi - Tan-brown and/or pink-flesh-colored symmetric macules and papules - Benign appearing on exam today - Observation - Call clinic for new or changing moles - Recommend daily use of broad spectrum spf 30+ sunscreen to sun-exposed areas.   Hemangiomas - Red papules - Discussed benign nature - Observe - Call for any changes  Actinic Damage - diffuse scaly erythematous macules with underlying  dyspigmentation - Recommend daily broad spectrum sunscreen SPF 30+ to sun-exposed areas, reapply every 2 hours as needed.  - Call for new or changing lesions.  Skin cancer screening performed today.  History of Basal Cell Carcinoma of the Skin - No evidence of recurrence today at  nose 2011 treated with Moh's - Recommend regular full body skin exams - Recommend daily broad spectrum sunscreen SPF 30+ to sun-exposed areas, reapply every 2 hours as needed.  - Call if any new or changing lesions are noted between office visits  Return in about 1 year (around 08/12/2021) for TBSE.  Graciella Belton, RMA, am acting as scribe for Forest Gleason, MD .  Documentation: I have reviewed the above documentation for accuracy and completeness, and I agree with the above.  Forest Gleason, MD

## 2020-08-18 ENCOUNTER — Other Ambulatory Visit: Payer: Self-pay

## 2020-08-18 ENCOUNTER — Encounter: Payer: Self-pay | Admitting: Dermatology

## 2020-08-18 DIAGNOSIS — D239 Other benign neoplasm of skin, unspecified: Secondary | ICD-10-CM

## 2020-08-18 NOTE — Progress Notes (Signed)
ATYPICAL JUNCTIONAL LENTIGINOUS MELANOCYTIC PROLIFERATION, PERIPHERAL MARGIN INVOLVED, Cannot rule out early evolving lentigo maligna  "Atypical spot, cannot rule out early melanoma in situ"--> Mohs surgery at [facility] with Dr. Manley Mason    Discussed biopsy results and recommendation for Mohs surgery. Also discussed recommended screening for skin cancer every 3 months in first year. Advised to call for any new or changing lesions between appointments.   MAs please refer. Please also schedule Sean Harvey for FBSE in 3 months.

## 2020-08-19 ENCOUNTER — Other Ambulatory Visit: Payer: Self-pay

## 2020-08-19 DIAGNOSIS — D239 Other benign neoplasm of skin, unspecified: Secondary | ICD-10-CM

## 2020-09-05 IMAGING — CT CT ANGIO NECK
1 of 12 series · 5 of 33 positions shown · IV contrast (OMNI 350)
Comparison: No pertinent prior studies available for comparison.

CLINICAL DATA: Focal neuro deficit, greater than 6 hours, stroke
suspected. Additional history provided: Intermittent dizziness,
blurred vision.



[Series 11: cta neck axial · axial · 0.39mm/px · z∈[-259,-18]mm · 5 of 363 slices shown]
[im 61/363  soft-tissue]
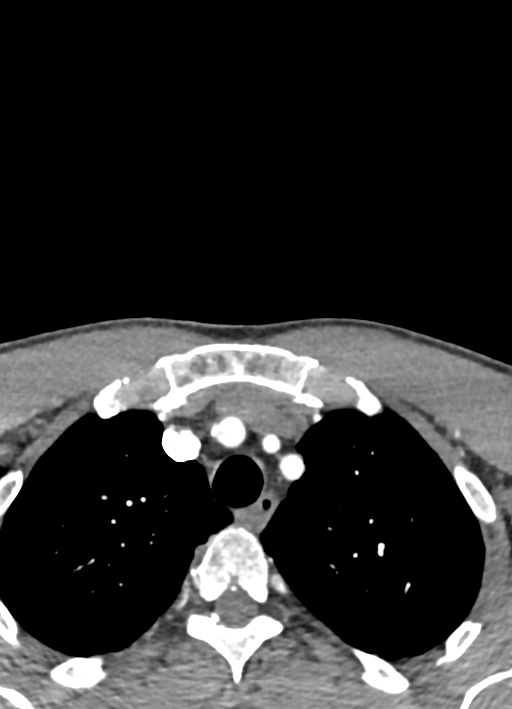
[im 121/363  bone]
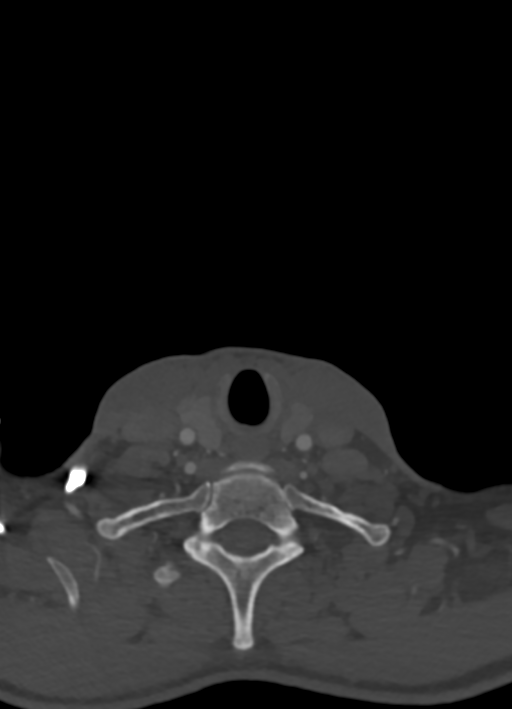
[im 182/363  soft-tissue]
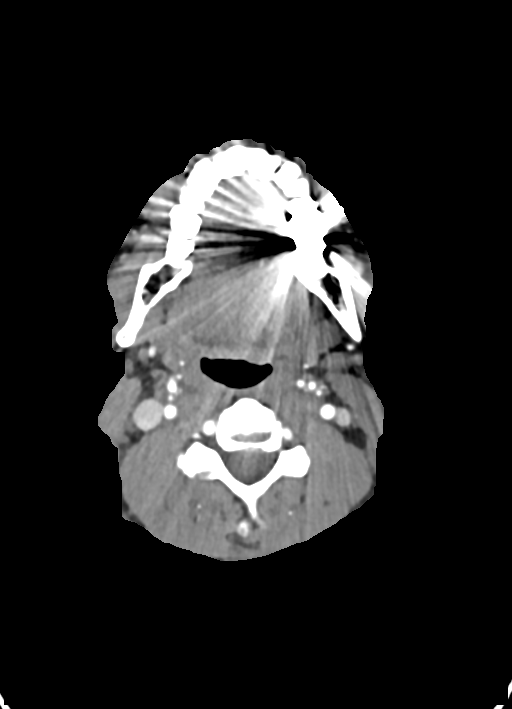
[im 242/363  bone]
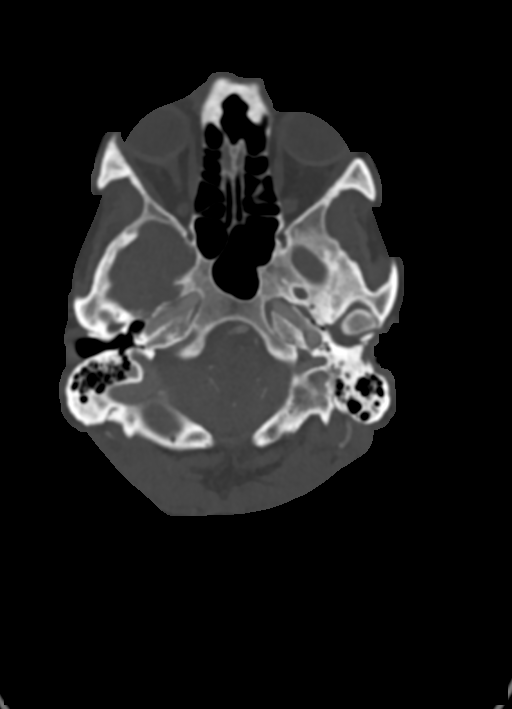
[im 302/363  soft-tissue]
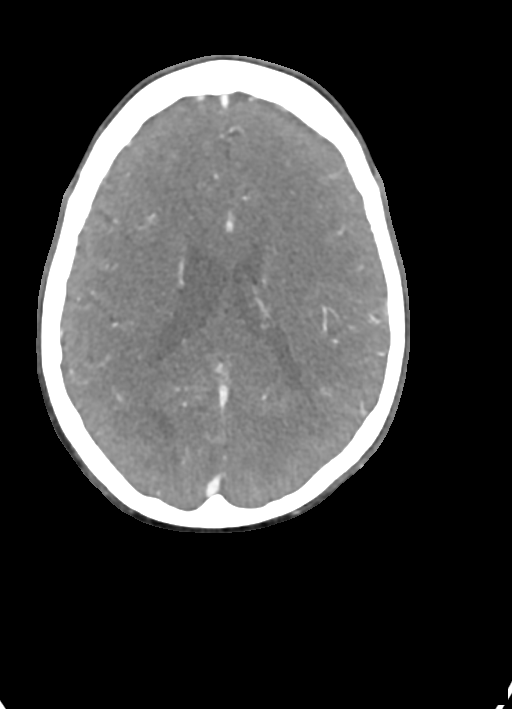

[5 of 33 positions shown; findings below may reference images not displayed]

FINDINGS: CT HEAD FINDINGS

Brain:

There is no acute intracranial hemorrhage.

No demarcated cortical infarct.

No extra-axial fluid collection.

No evidence of intracranial mass.

No midline shift.

Vascular: Reported below.

Skull: Normal. Negative for fracture or focal lesion.

Sinuses: Mild ethmoid sinus mucosal thickening. No significant
mastoid effusion.

Orbits: No acute abnormality.

Review of the MIP images confirms the above findings

CTA NECK FINDINGS

Aortic arch: Standard aortic branching. Minimal calcified plaque
within the proximal right subclavian artery. No hemodynamically
significant innominate or proximal subclavian artery stenosis.

Right carotid system: CCA and ICA patent within the neck without
measurable stenosis. Trace mixed plaque within the carotid bulb.

Left carotid system: CCA and ICA patent within the neck without
measurable stenosis. Trace mixed plaque within the carotid bulb.

Vertebral arteries: The vertebral arteries are patent within the
neck bilaterally without measurable stenosis. The right vertebral
artery is dominant.

Skeleton: No acute bony abnormality. Cervical spondylosis with
multilevel disc space narrowing, posterior disc osteophytes and
uncovertebral hypertrophy.

Other neck: No neck mass or cervical lymphadenopathy.

Upper chest: No consolidation within the imaged lung apices.

Review of the MIP images confirms the above findings

CTA HEAD FINDINGS

Anterior circulation:

The intracranial internal carotid arteries are patent. Minimal
calcified plaque within these vessels without significant stenosis.

The M1 middle cerebral arteries are patent without significant
stenosis. No M2 proximal branch occlusion or high-grade proximal
stenosis is identified.

The anterior cerebral arteries are patent without significant
proximal stenosis.

No intracranial aneurysm is identified.

Posterior circulation:

The dominant intracranial right vertebral artery is patent without
significant stenosis. The non dominant intracranial left vertebral
artery is developmentally diminutive beyond the origin of the left
PICA, although patent.

Hypoplastic P1 right PCA segment with sizable right posterior
communicating artery. The posterior cerebral is are patent
proximally without significant stenosis. The left posterior
communicating artery is hypoplastic or absent.

Venous sinuses: Within limitations of contrast timing, no convincing
thrombus.

Anatomic variants: As described

Review of the MIP images confirms the above findings
IMPRESSION: CT head:

Unremarkable CT appearance of the brain. No evidence of acute
intracranial abnormality.

CTA neck:

The bilateral common carotid, internal carotid and vertebral
arteries are patent within the neck without significant stenosis.

CTA head:

1. No intracranial large vessel occlusion or proximal high-grade
arterial stenosis.
2. Mild calcified plaque within the intracranial ICAs.

## 2020-09-22 ENCOUNTER — Ambulatory Visit: Admitting: Dermatology

## 2020-11-24 ENCOUNTER — Encounter: Admitting: Dermatology

## 2020-12-14 ENCOUNTER — Encounter: Admitting: Dermatology

## 2021-04-06 ENCOUNTER — Other Ambulatory Visit: Payer: Self-pay

## 2021-04-06 ENCOUNTER — Ambulatory Visit (INDEPENDENT_AMBULATORY_CARE_PROVIDER_SITE_OTHER): Admitting: Dermatology

## 2021-04-06 DIAGNOSIS — Z872 Personal history of diseases of the skin and subcutaneous tissue: Secondary | ICD-10-CM

## 2021-04-06 DIAGNOSIS — Z1283 Encounter for screening for malignant neoplasm of skin: Secondary | ICD-10-CM

## 2021-04-06 DIAGNOSIS — L910 Hypertrophic scar: Secondary | ICD-10-CM | POA: Diagnosis not present

## 2021-04-06 DIAGNOSIS — L814 Other melanin hyperpigmentation: Secondary | ICD-10-CM

## 2021-04-06 DIAGNOSIS — Z85828 Personal history of other malignant neoplasm of skin: Secondary | ICD-10-CM

## 2021-04-06 DIAGNOSIS — L570 Actinic keratosis: Secondary | ICD-10-CM | POA: Diagnosis not present

## 2021-04-06 DIAGNOSIS — L578 Other skin changes due to chronic exposure to nonionizing radiation: Secondary | ICD-10-CM

## 2021-04-06 DIAGNOSIS — D229 Melanocytic nevi, unspecified: Secondary | ICD-10-CM

## 2021-04-06 DIAGNOSIS — L821 Other seborrheic keratosis: Secondary | ICD-10-CM

## 2021-04-06 DIAGNOSIS — D1800 Hemangioma unspecified site: Secondary | ICD-10-CM

## 2021-04-06 MED ORDER — FLUOROURACIL 5 % EX CREA: TOPICAL_CREAM | CUTANEOUS | 0 refills | Status: AC

## 2021-04-06 NOTE — Progress Notes (Signed)
Follow-Up Visit   Subjective  Sean Harvey is a 65 y.o. male who presents for the following: tbse (Patient here today for tbse. He has history of aks and history of bcc on nose. He reports no areas of concern today).  Patient here for full body skin exam and skin cancer screening.  The following portions of the chart were reviewed this encounter and updated as appropriate:  Tobacco  Allergies  Meds  Problems  Med Hx  Surg Hx  Fam Hx      Objective  Well appearing patient in no apparent distress; mood and affect are within normal limits.  A full examination was performed including scalp, head, eyes, ears, nose, lips, neck, chest, axillae, abdomen, back, buttocks, bilateral upper extremities, bilateral lower extremities, hands, feet, fingers, toes, fingernails, and toenails. All findings within normal limits unless otherwise noted below.  Objective  Left Upper Arm x 1, right dorsal hand x 1, right frontal scalp x 1, right forearm x2 (5): Erythematous thin papules/macules with gritty scale.   Objective  Mid Back: Scar at site of biopsy   Assessment & Plan  Actinic keratosis (5) Left Upper Arm x 1, right dorsal hand x 1, right frontal scalp x 1, right forearm x2  Hypertrophic   Prior to procedure, discussed risks of blister formation, small wound, skin dyspigmentation, or rare scar following cryotherapy.      Destruction of lesion - Left Upper Arm x 1, right dorsal hand x 1, right frontal scalp x 1, right forearm x2  Destruction method: cryotherapy   Informed consent: discussed and consent obtained   Lesion destroyed using liquid nitrogen: Yes   Cryotherapy cycles:  2 Outcome: patient tolerated procedure well with no complications   Post-procedure details: wound care instructions given    fluorouracil (EFUDEX) 5 % cream - Left Upper Arm x 1, right dorsal hand x 1, right frontal scalp x 1, right forearm x2  Hypertrophic scar Mid Back  [facility],  [facility] reports benign    Benign appearing today. Call for changes.   Lentigines - Scattered tan macules - Due to sun exposure - Benign-appering, observe - Recommend daily broad spectrum sunscreen SPF 30+ to sun-exposed areas, reapply every 2 hours as needed. - Call for any changes  Seborrheic Keratoses - Stuck-on, waxy, tan-brown papules and/or plaques  - Benign-appearing - Discussed benign etiology and prognosis. - Observe - Call for any changes  Melanocytic Nevi - Tan-brown and/or pink-flesh-colored symmetric macules and papules - Benign appearing on exam today - Observation - Call clinic for new or changing moles - Recommend daily use of broad spectrum spf 30+ sunscreen to sun-exposed areas.   Hemangiomas - Red papules - Discussed benign nature - Observe - Call for any changes  Actinic Damage - Chronic condition, secondary to cumulative UV/sun exposure - diffuse scaly erythematous macules with underlying dyspigmentation - Recommend daily broad spectrum sunscreen SPF 30+ to sun-exposed areas, reapply every 2 hours as needed.  - Staying in the shade or wearing long sleeves, sun glasses (UVA+UVB protection) and wide brim hats (4-inch brim around the entire circumference of the hat) are also recommended for sun protection.  - Call for new or changing lesions.  History of Basal Cell Carcinoma of the Skin - No evidence of recurrence today nose 2011 treated with Moh's - Recommend regular full body skin exams - Recommend daily broad spectrum sunscreen SPF 30+ to sun-exposed areas, reapply every 2 hours as needed.  -  Call if any new or changing lesions are noted between office visits   Actinic Damage - Severe, confluent actinic changes with pre-cancerous actinic keratoses  - Lower lip - Severe, chronic, not at goal, secondary to cumulative UV radiation exposure over time - diffuse scaly erythematous macules and papules with underlying dyspigmentation - Discussed  Prescription "Field Treatment" for Severe, Chronic Confluent Actinic Changes with Pre-Cancerous Actinic Keratoses Field treatment involves treatment of an entire area of skin that has confluent Actinic Changes (Sun/ Ultraviolet light damage) and PreCancerous Actinic Keratoses by method of PhotoDynamic Therapy (PDT) and/or prescription Topical Chemotherapy agents such as 5-fluorouracil, 5-fluorouracil/calcipotriene, and/or imiquimod.  The purpose is to decrease the number of clinically evident and subclinical PreCancerous lesions to prevent progression to development of skin cancer by chemically destroying early precancer changes that may or may not be visible.  It has been shown to reduce the risk of developing skin cancer in the treated area. As a result of treatment, redness, scaling, crusting, and open sores may occur during treatment course. One or more than one of these methods may be used and may have to be used several times to control, suppress and eliminate the PreCancerous changes. Discussed treatment course, expected reaction, and possible side effects. - Recommend daily broad spectrum sunscreen SPF 30+ to sun-exposed areas, reapply every 2 hours as needed.  - Staying in the shade or wearing long sleeves, sun glasses (UVA+UVB protection) and wide brim hats (4-inch brim around the entire circumference of the hat) are also recommended. - Call for new or changing lesions. - For lower lip actinic cheilitis Start 5-fluorouracil/calcipotriene cream daily for 4 days to affected areas including lower lip. Prescription sent to [facility]. Patient provided with contact information for pharmacy and advised the pharmacy will mail the prescription to their home. Patient provided with handout reviewing treatment course and side effects and advised to call or message [place] on MyChart with any concerns. - Reviewed course of treatment and expected reaction.  Patient advised to expect inflammation and crusting  and advised that erosions are possible.  Patient advised to be diligent with sun protection during and after treatment. Handout with details of how to apply medication and what to expect provided.  Skin cancer screening performed today.  Return in about 3 months (around 07/07/2021) for ak followup, 6 month tbse.  I, Ruthell Rummage, CMA, am acting as scribe for Forest Gleason, MD.  Documentation: I have reviewed the above documentation for accuracy and completeness, and I agree with the above.  Forest Gleason, MD

## 2021-04-06 NOTE — Patient Instructions (Addendum)
Wait to get prescription 5-fluorouracil/calcipotriene cream until after follow-up. We will plan to wait to treat your lip in the fall.   Melanoma ABCDEs  Melanoma is the most dangerous type of skin cancer, and is the leading cause of death from skin disease.  You are more likely to develop melanoma if you:  Have light-colored skin, light-colored eyes, or red or blond hair  Spend a lot of time in the sun  Tan regularly, either outdoors or in a tanning bed  Have had blistering sunburns, especially during childhood  Have a close family member who has had a melanoma  Have atypical moles or large birthmarks  Early detection of melanoma is key since treatment is typically straightforward and cure rates are extremely high if we catch it early.   The first sign of melanoma is often a change in a mole or a new dark spot.  The ABCDE system is a way of remembering the signs of melanoma.  A for asymmetry:  The two halves do not match. B for border:  The edges of the growth are irregular. C for color:  A mixture of colors are present instead of an even brown color. D for diameter:  Melanomas are usually (but not always) greater than 68mm - the size of a pencil eraser. E for evolution:  The spot keeps changing in size, shape, and color.  Please check your skin once per month between visits. You can use a small mirror in front and a large mirror behind you to keep an eye on the back side or your body.   If you see any new or changing lesions before your next follow-up, please call to schedule a visit.  Please continue daily skin protection including broad spectrum sunscreen SPF 30+ to sun-exposed areas, reapplying every 2 hours as needed when you're outdoors.   Staying in the shade or wearing long sleeves, sun glasses (UVA+UVB protection) and wide brim hats (4-inch brim around the entire circumference of the hat) are also recommended for sun protection.   Recommend taking Heliocare sun protection  supplement daily in sunny weather for additional sun protection. For maximum protection on the sunniest days, you can take up to 2 capsules of regular Heliocare OR take 1 capsule of Heliocare Ultra. For prolonged exposure (such as a full day in the sun), you can repeat your dose of the supplement 4 hours after your first dose. Heliocare can be purchased at [facility] or at [email or web address].   Cryotherapy Aftercare  . Wash gently with soap and water everyday.   Marland Kitchen Apply Vaseline and Band-Aid daily until healed.   Actinic keratoses are precancerous spots that appear secondary to cumulative UV radiation exposure/sun exposure over time. They are chronic with expected duration over 1 year. A portion of actinic keratoses will progress to squamous cell carcinoma of the skin. It is not possible to reliably predict which spots will progress to skin cancer and so treatment is recommended to prevent development of skin cancer.  Recommend daily broad spectrum sunscreen SPF 30+ to sun-exposed areas, reapply every 2 hours as needed.  Recommend staying in the shade or wearing long sleeves, sun glasses (UVA+UVB protection) and wide brim hats (4-inch brim around the entire circumference of the hat). Call for new or changing lesions.

## 2021-04-18 ENCOUNTER — Encounter: Payer: Self-pay | Admitting: Dermatology

## 2021-07-26 ENCOUNTER — Other Ambulatory Visit: Payer: Self-pay | Admitting: Physician Assistant

## 2021-07-26 ENCOUNTER — Other Ambulatory Visit: Payer: Self-pay

## 2021-07-26 ENCOUNTER — Ambulatory Visit
Admission: RE | Admit: 2021-07-26 | Discharge: 2021-07-26 | Disposition: A | Discharge status: Home / Self Care | Source: Ambulatory Visit | Attending: Physician Assistant | Admitting: Physician Assistant

## 2021-07-26 DIAGNOSIS — M79604 Pain in right leg: Secondary | ICD-10-CM | POA: Insufficient documentation

## 2021-08-17 ENCOUNTER — Other Ambulatory Visit: Payer: Self-pay

## 2021-08-17 ENCOUNTER — Ambulatory Visit (INDEPENDENT_AMBULATORY_CARE_PROVIDER_SITE_OTHER): Admitting: Dermatology

## 2021-08-17 DIAGNOSIS — L578 Other skin changes due to chronic exposure to nonionizing radiation: Secondary | ICD-10-CM | POA: Diagnosis not present

## 2021-08-17 DIAGNOSIS — L570 Actinic keratosis: Secondary | ICD-10-CM

## 2021-08-17 DIAGNOSIS — L820 Inflamed seborrheic keratosis: Secondary | ICD-10-CM | POA: Diagnosis not present

## 2021-08-17 NOTE — Progress Notes (Signed)
Follow-Up Visit   Subjective  Sean Harvey is a 65 y.o. male who presents for the following: Follow-up (Patient here today for AK follow up. Patient advises there is a spot at right scalp that has been treated in the past but has recurred. ).  At patient's last visit, discussed using 5FU/calcipotriene to the lip, deferred.   The following portions of the chart were reviewed this encounter and updated as appropriate:   Tobacco  Allergies  Meds  Problems  Med Hx  Surg Hx  Fam Hx      Review of Systems:  No other skin or systemic complaints except as noted in HPI or Assessment and Plan.  Objective  Well appearing patient in no apparent distress; mood and affect are within normal limits.  A full examination was performed including scalp, head, eyes, ears, nose, lips, neck, chest, axillae, abdomen, back, buttocks, bilateral upper extremities, bilateral lower extremities, hands, feet, fingers, toes, fingernails, and toenails. All findings within normal limits unless otherwise noted below.  right occipital scalp x 2 (2) Erythematous keratotic or waxy stuck-on papule or plaque.   Scalp (3) Erythematous thin papules/macules with gritty scale.    Assessment & Plan  Inflamed seborrheic keratosis right occipital scalp x 2  Prior to procedure, discussed risks of blister formation, small wound, skin dyspigmentation, or rare scar following cryotherapy. Recommend Vaseline ointment to treated areas while healing.   Destruction of lesion - right occipital scalp x 2 Complexity: simple   Destruction method: cryotherapy   Informed consent: discussed and consent obtained   Timeout:  patient name, date of birth, surgical site, and procedure verified Lesion destroyed using liquid nitrogen: Yes   Region frozen until ice ball extended beyond lesion: Yes   Outcome: patient tolerated procedure well with no complications   Post-procedure details: wound care instructions given    AK (actinic  keratosis) (3) Scalp  Prior to procedure, discussed risks of blister formation, small wound, skin dyspigmentation, or rare scar following cryotherapy. Recommend Vaseline ointment to treated areas while healing.   Destruction of lesion - Scalp  Destruction method: cryotherapy   Informed consent: discussed and consent obtained   Lesion destroyed using liquid nitrogen: Yes   Cryotherapy cycles:  2 Outcome: patient tolerated procedure well with no complications   Post-procedure details: wound care instructions given    Actinic Damage - Severe, confluent actinic changes with pre-cancerous actinic keratoses  - Severe, chronic, not at goal, secondary to cumulative UV radiation exposure over time - diffuse scaly erythematous macules and papules with underlying dyspigmentation - Discussed Prescription "Field Treatment" for Severe, Chronic Confluent Actinic Changes with Pre-Cancerous Actinic Keratoses Field treatment involves treatment of an entire area of skin that has confluent Actinic Changes (Sun/ Ultraviolet light damage) and PreCancerous Actinic Keratoses by method of PhotoDynamic Therapy (PDT) and/or prescription Topical Chemotherapy agents such as 5-fluorouracil, 5-fluorouracil/calcipotriene, and/or imiquimod.  The purpose is to decrease the number of clinically evident and subclinical PreCancerous lesions to prevent progression to development of skin cancer by chemically destroying early precancer changes that may or may not be visible.  It has been shown to reduce the risk of developing skin cancer in the treated area. As a result of treatment, redness, scaling, crusting, and open sores may occur during treatment course. One or more than one of these methods may be used and may have to be used several times to control, suppress and eliminate the PreCancerous changes. Discussed treatment course, expected reaction, and possible side effects. -  Recommend daily broad spectrum sunscreen SPF 30+ to  sun-exposed areas, reapply every 2 hours as needed.  - Staying in the shade or wearing long sleeves, sun glasses (UVA+UVB protection) and wide brim hats (4-inch brim around the entire circumference of the hat) are also recommended. - Call for new or changing lesions. - Will plan January to treat lower lip with 5FU/calcipotriene daily for 4 days  Return in about 5 months (around 01/17/2022) for TBSE.  Graciella Belton, RMA, am acting as scribe for Forest Gleason, MD .  Documentation: I have reviewed the above documentation for accuracy and completeness, and I agree with the above.  Forest Gleason, MD

## 2021-08-17 NOTE — Patient Instructions (Addendum)
Cryotherapy Aftercare  Wash gently with soap and water everyday.   Apply Vaseline and Band-Aid daily until healed.   Prior to procedure, discussed risks of blister formation, small wound, skin dyspigmentation, or rare scar following cryotherapy. Recommend Vaseline ointment to treated areas while healing.  Recommend daily broad spectrum sunscreen SPF 30+ to sun-exposed areas, reapply every 2 hours as needed. Call for new or changing lesions.  Staying in the shade or wearing long sleeves, sun glasses (UVA+UVB protection) and wide brim hats (4-inch brim around the entire circumference of the hat) are also recommended for sun protection.   If you have any questions or concerns for your doctor, please call our main line at [phone] and press option 4 to reach your doctor's medical assistant. If no one answers, please leave a voicemail as directed and we will return your call as soon as possible. Messages left after 4 pm will be answered the following business day.   You may also send us a message via MyChart. We typically respond to MyChart messages within 1-2 business days.  For prescription refills, please ask your pharmacy to contact our office. Our fax number is [phone].  If you have an urgent issue when the clinic is closed that cannot wait until the next business day, you can page your doctor at the number below.    Please note that while we do our best to be available for urgent issues outside of office hours, we are not available 24/7.   If you have an urgent issue and are unable to reach us, you may choose to seek medical care at your doctor's office, retail clinic, urgent care center, or emergency room.  If you have a medical emergency, please immediately call 911 or go to the emergency department.  Pager Numbers  - Dr. Kowalski: [phone]  - Dr. Moye: [phone]  - Dr. Stewart: [phone]  In the event of inclement weather, please call our main line at  [phone] for an update on the status of any delays or closures.  Dermatology Medication Tips: Please keep the boxes that topical medications come in in order to help keep track of the instructions about where and how to use these. Pharmacies typically print the medication instructions only on the boxes and not directly on the medication tubes.   If your medication is too expensive, please contact our office at [phone] option 4 or send us a message through MyChart.   We are unable to tell what your co-pay for medications will be in advance as this is different depending on your insurance coverage. However, we may be able to find a substitute medication at lower cost or fill out paperwork to get insurance to cover a needed medication.   If a prior authorization is required to get your medication covered by your insurance company, please allow us 1-2 business days to complete this process.  Drug prices often vary depending on where the prescription is filled and some pharmacies may offer cheaper prices.  The website www.goodrx.com contains coupons for medications through different pharmacies. The prices here do not account for what the cost may be with help from insurance (it may be cheaper with your insurance), but the website can give you the price if you did not use any insurance.  - You can print the associated coupon and take it with your prescription to the pharmacy.  - You may also stop by our office during regular business hours and pick up a GoodRx coupon card.  -   If you need your prescription sent electronically to a different pharmacy, notify our office through [facility] or by phone at [phone] option 4.  In January -  Start 5-fluorouracil/calcipotriene cream twice a day for 4 days to affected areas including lower lip. Prescription sent to [facility]. Patient provided with contact information for pharmacy and advised the pharmacy will mail the prescription to  their home. Patient provided with handout reviewing treatment course and side effects and advised to call or message [place] on MyChart with any concerns.

## 2021-08-29 ENCOUNTER — Encounter: Payer: Self-pay | Admitting: Dermatology

## 2021-10-20 ENCOUNTER — Encounter: Admitting: Dermatology

## 2021-12-25 IMAGING — US US EXTREM LOW VENOUS*R*
1 series · 14 of 24 positions shown · non-contrast
Comparison: None.

CLINICAL DATA: Right lower extremity pain for 1 week

EXAM:
RIGHT LOWER EXTREMITY VENOUS DOPPLER ULTRASOUND
TECHNIQUE: Gray-scale sonography with compression, as well as color and duplex
ultrasound, were performed to evaluate the deep venous system(s)
from the level of the common femoral vein through the popliteal and
proximal calf veins.

[Series 1: us venous img lower uni right (dvt) · portal-venous · 14 of 35 slices shown]
[im 1/35]
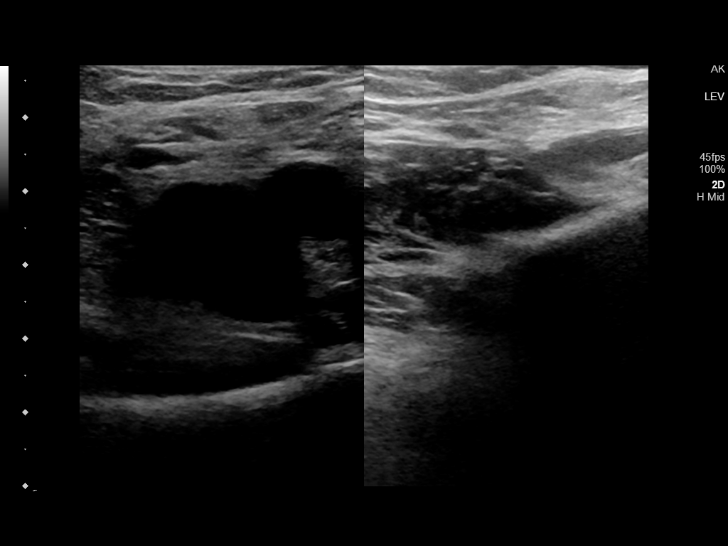
[im 3/35]
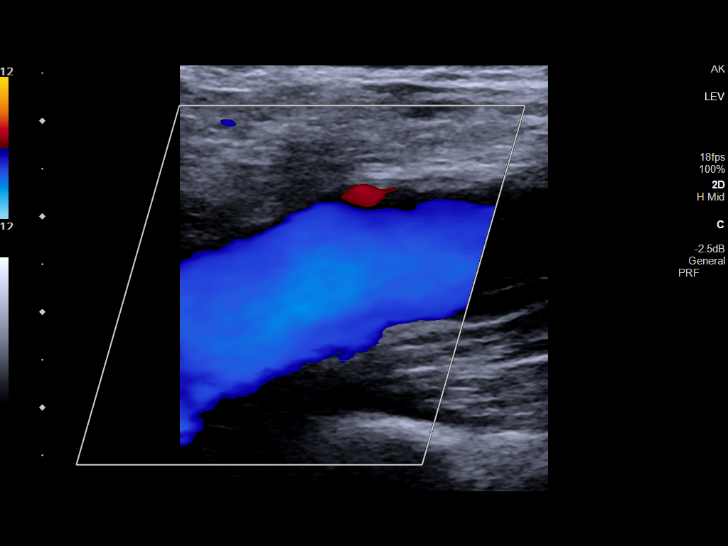
[im 6/35]
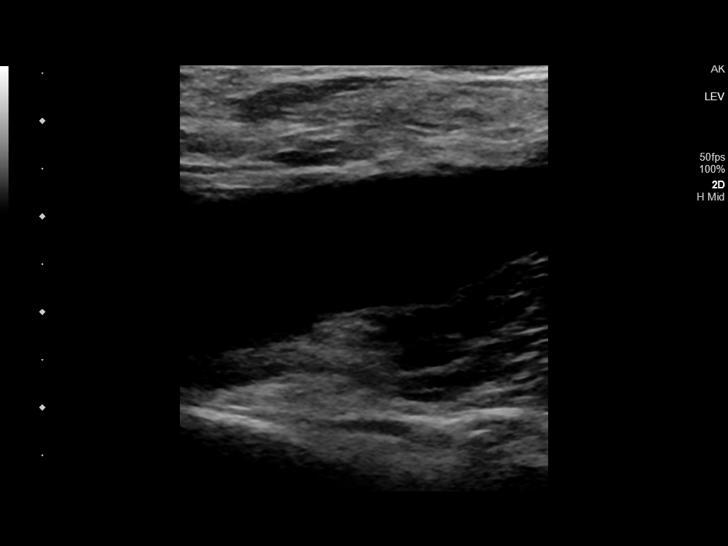
[im 9/35]
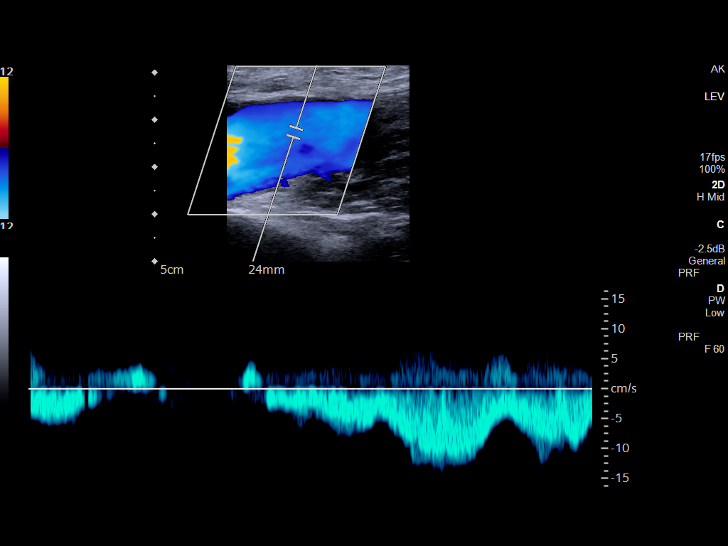
[im 11/35]
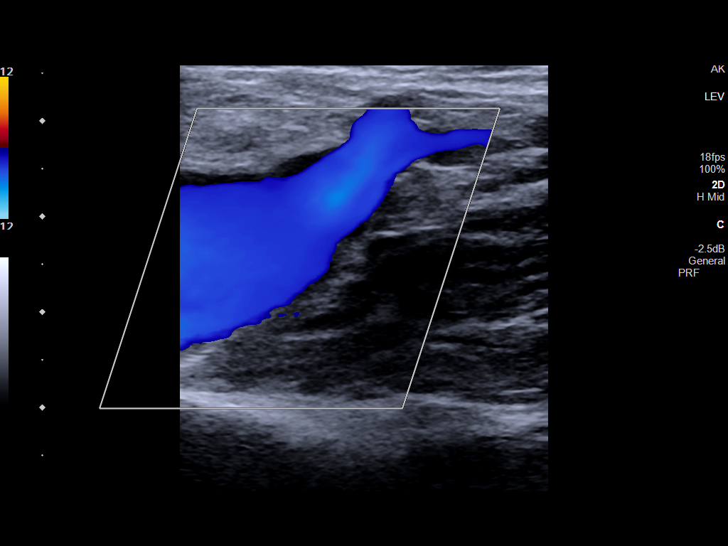
[im 14/35]
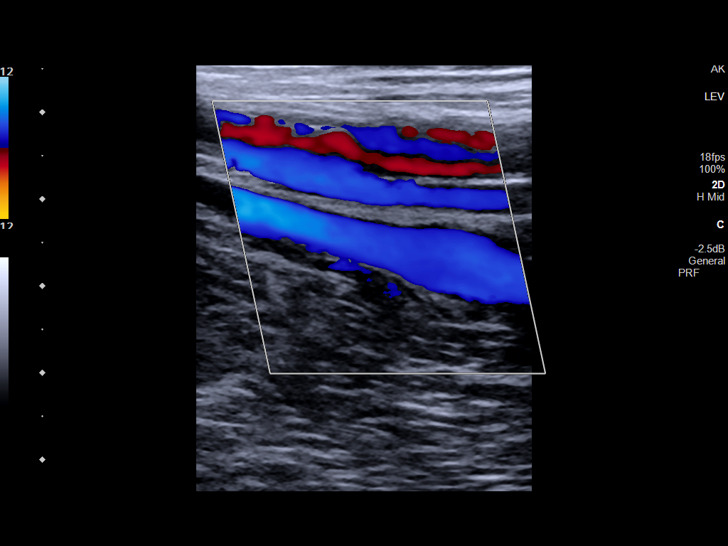
[im 17/35]
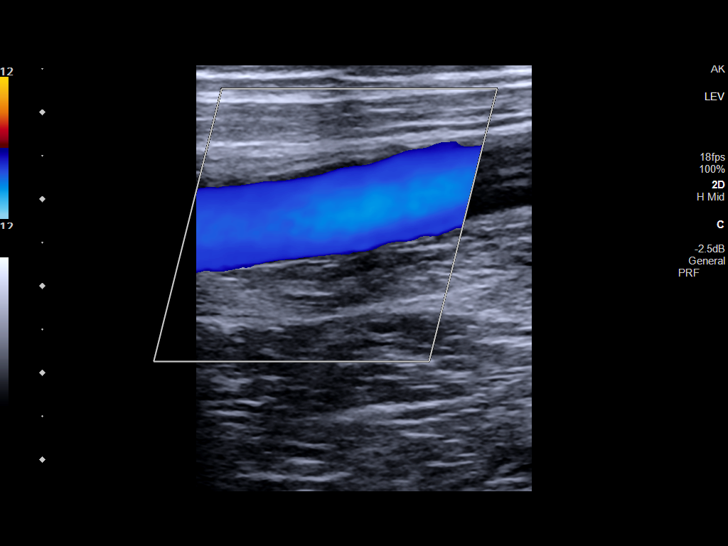
[im 18/35]
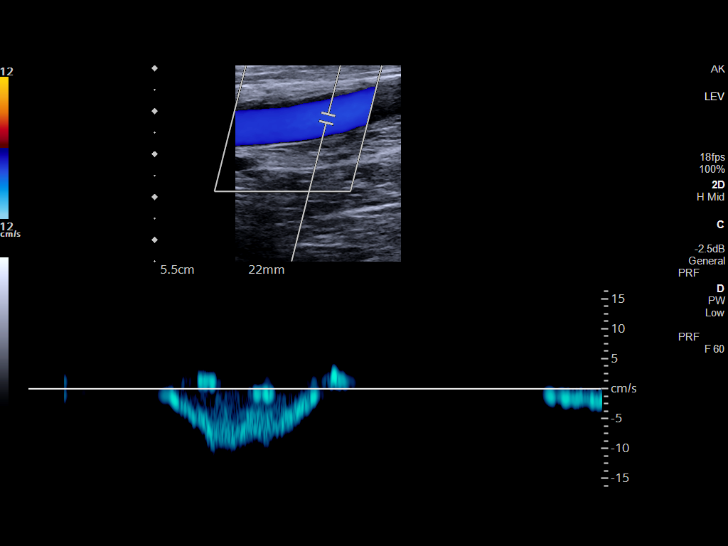
[im 21/35]
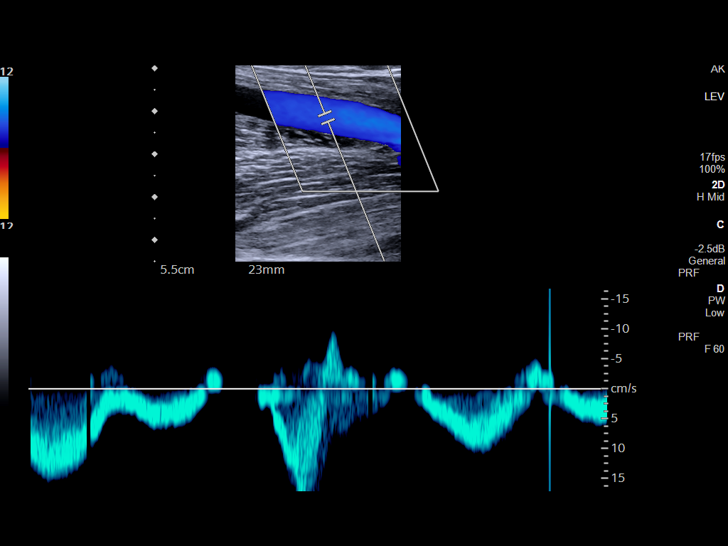
[im 24/35]
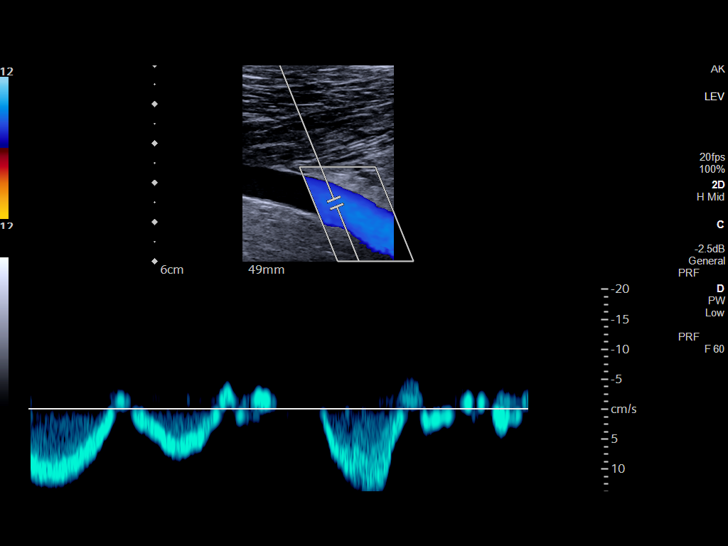
[im 27/35]
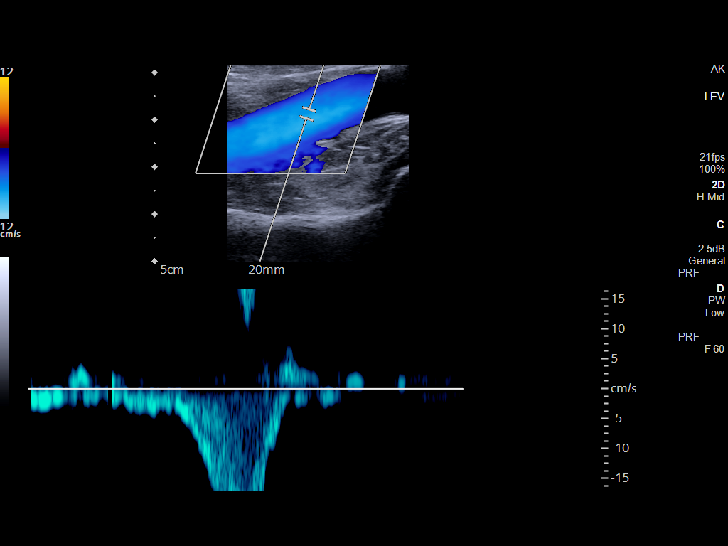
[im 29/35]
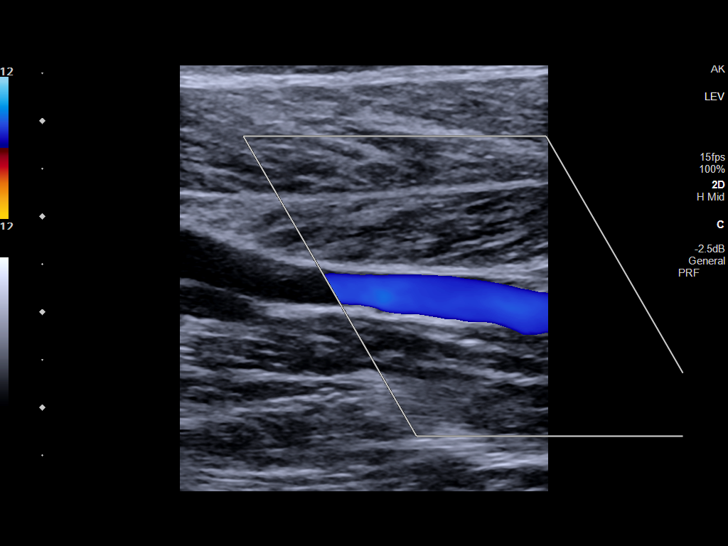
[im 32/35]
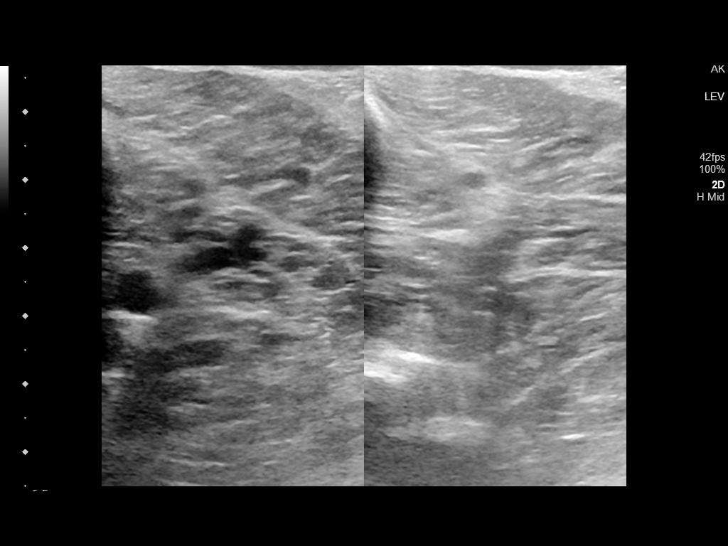
[im 35/35]
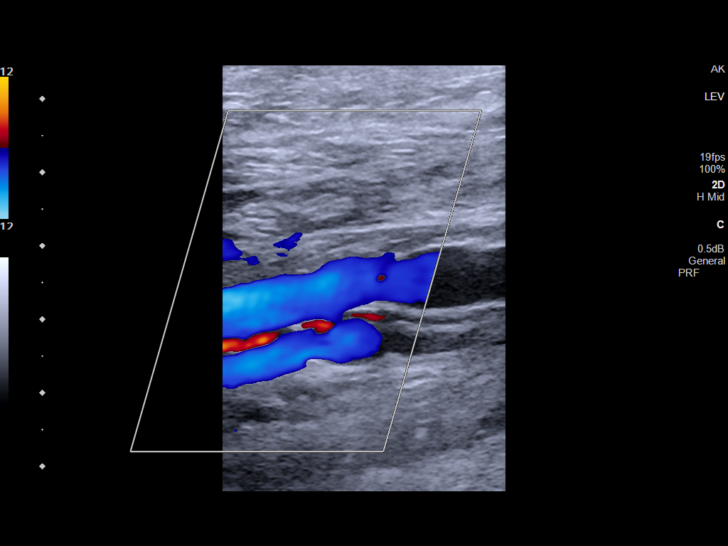

[14 of 24 positions shown; findings below may reference images not displayed]

FINDINGS: VENOUS

Normal compressibility of the common femoral, superficial femoral,
and popliteal veins, as well as the visualized calf veins.
Visualized portions of profunda femoral vein and great saphenous
vein unremarkable. No filling defects to suggest DVT on grayscale or
color Doppler imaging. Doppler waveforms show normal direction of
venous flow, normal respiratory plasticity and response to
augmentation.

Limited views of the contralateral common femoral vein are
unremarkable.

OTHER

None.

Limitations: none
IMPRESSION: No evidence of deep venous thrombosis in the right lower extremity.

## 2022-01-19 ENCOUNTER — Ambulatory Visit: Admitting: Dermatology

## 2022-03-20 DIAGNOSIS — Z7901 Long term (current) use of anticoagulants: Secondary | ICD-10-CM | POA: Insufficient documentation

## 2022-03-20 DIAGNOSIS — Z9081 Acquired absence of spleen: Secondary | ICD-10-CM | POA: Insufficient documentation

## 2022-03-20 DIAGNOSIS — Z8739 Personal history of other diseases of the musculoskeletal system and connective tissue: Secondary | ICD-10-CM | POA: Insufficient documentation

## 2022-03-20 DIAGNOSIS — B351 Tinea unguium: Secondary | ICD-10-CM | POA: Insufficient documentation

## 2022-03-20 DIAGNOSIS — M1990 Unspecified osteoarthritis, unspecified site: Secondary | ICD-10-CM | POA: Insufficient documentation

## 2022-03-20 DIAGNOSIS — Z85820 Personal history of malignant melanoma of skin: Secondary | ICD-10-CM | POA: Insufficient documentation

## 2022-08-08 ENCOUNTER — Ambulatory Visit (INDEPENDENT_AMBULATORY_CARE_PROVIDER_SITE_OTHER): Admitting: Dermatology

## 2022-08-08 ENCOUNTER — Encounter: Payer: Self-pay | Admitting: Dermatology

## 2022-08-08 DIAGNOSIS — C44619 Basal cell carcinoma of skin of left upper limb, including shoulder: Secondary | ICD-10-CM

## 2022-08-08 DIAGNOSIS — L570 Actinic keratosis: Secondary | ICD-10-CM | POA: Diagnosis not present

## 2022-08-08 DIAGNOSIS — Z1283 Encounter for screening for malignant neoplasm of skin: Secondary | ICD-10-CM

## 2022-08-08 DIAGNOSIS — C44319 Basal cell carcinoma of skin of other parts of face: Secondary | ICD-10-CM | POA: Diagnosis not present

## 2022-08-08 DIAGNOSIS — D229 Melanocytic nevi, unspecified: Secondary | ICD-10-CM

## 2022-08-08 DIAGNOSIS — L820 Inflamed seborrheic keratosis: Secondary | ICD-10-CM

## 2022-08-08 DIAGNOSIS — L578 Other skin changes due to chronic exposure to nonionizing radiation: Secondary | ICD-10-CM

## 2022-08-08 DIAGNOSIS — C44629 Squamous cell carcinoma of skin of left upper limb, including shoulder: Secondary | ICD-10-CM | POA: Diagnosis not present

## 2022-08-08 DIAGNOSIS — D1801 Hemangioma of skin and subcutaneous tissue: Secondary | ICD-10-CM

## 2022-08-08 DIAGNOSIS — L814 Other melanin hyperpigmentation: Secondary | ICD-10-CM

## 2022-08-08 DIAGNOSIS — L111 Transient acantholytic dermatosis [Grover]: Secondary | ICD-10-CM

## 2022-08-08 DIAGNOSIS — D492 Neoplasm of unspecified behavior of bone, soft tissue, and skin: Secondary | ICD-10-CM

## 2022-08-08 DIAGNOSIS — L821 Other seborrheic keratosis: Secondary | ICD-10-CM

## 2022-08-08 DIAGNOSIS — C4492 Squamous cell carcinoma of skin, unspecified: Secondary | ICD-10-CM

## 2022-08-08 DIAGNOSIS — L739 Follicular disorder, unspecified: Secondary | ICD-10-CM | POA: Diagnosis not present

## 2022-08-08 DIAGNOSIS — Z85828 Personal history of other malignant neoplasm of skin: Secondary | ICD-10-CM

## 2022-08-08 DIAGNOSIS — L308 Other specified dermatitis: Secondary | ICD-10-CM

## 2022-08-08 DIAGNOSIS — Z86018 Personal history of other benign neoplasm: Secondary | ICD-10-CM

## 2022-08-08 DIAGNOSIS — C4491 Basal cell carcinoma of skin, unspecified: Secondary | ICD-10-CM

## 2022-08-08 HISTORY — DX: Basal cell carcinoma of skin, unspecified: C44.91

## 2022-08-08 HISTORY — DX: Squamous cell carcinoma of skin, unspecified: C44.92

## 2022-08-08 MED ORDER — TRIAMCINOLONE ACETONIDE 0.1 % EX CREA: 1.0000 | TOPICAL_CREAM | Freq: Two times a day (BID) | CUTANEOUS | 3 refills | Status: AC | PRN

## 2022-08-08 MED ORDER — FLUOROURACIL 5 % EX CREA: TOPICAL_CREAM | CUTANEOUS | 0 refills | Status: AC

## 2022-08-08 NOTE — Patient Instructions (Addendum)
Cryotherapy Aftercare  Wash gently with soap and water everyday.   Apply Vaseline Jelly daily until healed.   - Start 5-fluorouracil/calcipotriene cream daily for 7 days to affected areas including lip.    Your prescription was sent to [facility] in [place]. A representative from [employer] will contact you within 2 business hours to verify your address and insurance information to schedule a free delivery. If for any reason you do not receive a phone call from them, please reach out to them. Their phone number is [phone] and their hours are Monday-Friday 9:00 am-5:00 pm.    5-fluorouracil is is a type of field treatment used to treat precancers, thin skin cancers, and areas of sun damage. Expected reaction includes irritation and mild inflammation potentially progressing to more severe inflammation including redness, scaling, crusting and open sores/erosions.  If too much irritation occurs, ensure application of only a thin layer and decrease frequency of use to achieve a tolerable level of inflammation. Recommend applying Vaseline ointment to open sores as needed.  Minimize sun exposure while under treatment. Recommend daily broad spectrum sunscreen SPF 30+ to sun-exposed areas, reapply every 2 hours as needed.       Chest: Start Triamcinolone 0.1% cream twice daily up to 2 weeks as needed for itching. Avoid applying to face, groin, and axilla. Use as directed. Long-term use can cause thinning of the skin.  Topical steroids (such as triamcinolone, fluocinolone, fluocinonide, mometasone, clobetasol, halobetasol, betamethasone, hydrocortisone) can cause thinning and lightening of the skin if they are used for too long in the same area. Your physician has selected the right strength medicine for your problem and area affected on the body. Please use your medication only as directed by your physician to prevent side effects.   Recommend OTC Gold Bond Rapid Relief Anti-Itch cream  (pramoxine + menthol), CeraVe Anti-itch cream or lotion (pramoxine), Sarna lotion (Original- menthol + camphor or Sensitive- pramoxine) or Eucerin 12 hour Itch Relief lotion (menthol) up to 3 times per day to areas on body that are itchy.   Wound Care Instructions  Cleanse wound gently with soap and water once a day then pat dry with clean gauze. Apply a thin coat of Petrolatum (petroleum jelly, "Vaseline") over the wound (unless you have an allergy to this). We recommend that you use a new, sterile tube of Vaseline. Do not pick or remove scabs. Do not remove the yellow or white "healing tissue" from the base of the wound.  Cover the wound with fresh, clean, nonstick gauze and secure with paper tape. You may use Band-Aids in place of gauze and tape if the wound is small enough, but would recommend trimming much of the tape off as there is often too much. Sometimes Band-Aids can irritate the skin.  You should call the office for your biopsy report after 1 week if you have not already been contacted.  If you experience any problems, such as abnormal amounts of bleeding, swelling, significant bruising, significant pain, or evidence of infection, please call the office immediately.  FOR ADULT SURGERY PATIENTS: If you need something for pain relief you may take 1 extra strength Tylenol (acetaminophen) AND 2 Ibuprofen ('200mg'$  each) together every 4 hours as needed for pain. (do not take these if you are allergic to them or if you have a reason you should not take them.) Typically, you may only need pain medication for 1 to 3 days.      Recommend daily broad spectrum sunscreen SPF 30+  to sun-exposed areas, reapply every 2 hours as needed. Call for new or changing lesions.  Staying in the shade or wearing long sleeves, sun glasses (UVA+UVB protection) and wide brim hats (4-inch brim around the entire circumference of the hat) are also recommended for sun protection.    Recommend taking Heliocare sun  protection supplement daily in sunny weather for additional sun protection. For maximum protection on the sunniest days, you can take up to 2 capsules of regular Heliocare OR take 1 capsule of Heliocare Ultra. For prolonged exposure (such as a full day in the sun), you can repeat your dose of the supplement 4 hours after your first dose. Heliocare can be purchased at [employer], at some [facility] or at [email or web address].       Melanoma ABCDEs  Melanoma is the most dangerous type of skin cancer, and is the leading cause of death from skin disease.  You are more likely to develop melanoma if you: Have light-colored skin, light-colored eyes, or red or blond hair Spend a lot of time in the sun Tan regularly, either outdoors or in a tanning bed Have had blistering sunburns, especially during childhood Have a close family member who has had a melanoma Have atypical moles or large birthmarks  Early detection of melanoma is key since treatment is typically straightforward and cure rates are extremely high if we catch it early.   The first sign of melanoma is often a change in a mole or a new dark spot.  The ABCDE system is a way of remembering the signs of melanoma.  A for asymmetry:  The two halves do not match. B for border:  The edges of the growth are irregular. C for color:  A mixture of colors are present instead of an even brown color. D for diameter:  Melanomas are usually (but not always) greater than 1m - the size of a pencil eraser. E for evolution:  The spot keeps changing in size, shape, and color.  Please check your skin once per month between visits. You can use a small mirror in front and a large mirror behind you to keep an eye on the back side or your body.   If you see any new or changing lesions before your next follow-up, please call to schedule a visit.  Please continue daily skin protection including broad spectrum sunscreen SPF 30+ to sun-exposed areas,  reapplying every 2 hours as needed when you're outdoors.   Staying in the shade or wearing long sleeves, sun glasses (UVA+UVB protection) and wide brim hats (4-inch brim around the entire circumference of the hat) are also recommended for sun protection.     Due to recent changes in healthcare laws, you may see results of your pathology and/or laboratory studies on MyChart before the doctors have had a chance to review them. We understand that in some cases there may be results that are confusing or concerning to you. Please understand that not all results are received at the same time and often the doctors may need to interpret multiple results in order to provide you with the best plan of care or course of treatment. Therefore, we ask that you please give u[place]2 business days to thoroughly review all your results before contacting the office for clarification. Should we see a critical lab result, you will be contacted sooner.   If You Need Anything After Your Visit  If you have any questions or concerns for your doctor, please call our  main line at [phone] and press option 4 to reach your doctor's medical assistant. If no one answers, please leave a voicemail as directed and we will return your call as soon as possible. Messages left after 4 pm will be answered the following business day.   You may also send [place] a message via [place]. We typically respond to MyChart messages within 1-2 business days.  For prescription refills, please ask your pharmacy to contact our office. Our fax number is [phone].  If you have an urgent issue when the clinic is closed that cannot wait until the next business day, you can page your doctor at the number below.    Please note that while we do our best to be available for urgent issues outside of office hours, we are not available 24/7.   If you have an urgent issue and are unable to reach [place], you may choose to seek medical care at your doctor's office,  retail clinic, urgent care center, or emergency room.  If you have a medical emergency, please immediately call 911 or go to the emergency department.  Pager Numbers  - Dr. Nehemiah Massed: [phone]  - Dr. Laurence Ferrari: [phone]  - Dr. Nicole Kindred: [phone]  In the event of inclement weather, please call our main line at [phone] for an update on the status of any delays or closures.  Dermatology Medication Tips: Please keep the boxes that topical medications come in in order to help keep track of the instructions about where and how to use these. Pharmacies typically print the medication instructions only on the boxes and not directly on the medication tubes.   If your medication is too expensive, please contact our office at [phone] option 4 or send [place] a message through [place].   We are unable to tell what your co-pay for medications will be in advance as this is different depending on your insurance coverage. However, we may be able to find a substitute medication at lower cost or fill out paperwork to get insurance to cover a needed medication.   If a prior authorization is required to get your medication covered by your insurance company, please allow [place] 1-2 business days to complete this process.  Drug prices often vary depending on where the prescription is filled and some pharmacies may offer cheaper prices.  The website www.goodrx.com contains coupons for medications through different pharmacies. The prices here do not account for what the cost may be with help from insurance (it may be cheaper with your insurance), but the website can give you the price if you did not use any insurance.  - You can print the associated coupon and take it with your prescription to the pharmacy.  - You may also stop by our office during regular business hours and pick up a GoodRx coupon card.  - If you need your prescription sent electronically to a different pharmacy, notify our office through  [facility] or by phone at [phone] option 4.     Si Usted Necesita Algo Despus de Su Visita  Tambin puede enviarnos un mensaje a travs de Pharmacist, community. Por lo general respondemos a los mensajes de MyChart en el transcurso de 1 a 2 das hbiles.  Para renovar recetas, por favor pida a su farmacia que se ponga en contacto con nuestra oficina. Harland Dingwall de fax es [place] [phone].  Si tiene un asunto urgente cuando la clnica est cerrada y que no puede esperar hasta el siguiente da hbil, Hydrologist  a su doctor(a) al nmero que aparece a continuacin.   Por favor, tenga en cuenta que aunque hacemos todo lo posible para estar disponibles para asuntos urgentes fuera del horario de [place], no estamos disponibles las 24 horas del da, los 7 das de la [place].   Si tiene un problema urgente y no puede comunicarse con nosotros, puede optar por buscar atencin mdica  en el consultorio de su doctor(a), en una clnica privada, en un centro de atencin urgente o en una sala de emergencias.  Si tiene Engineering geologist, por favor llame inmediatamente al 911 o vaya a la sala de emergencias.  Nmeros de bper  - Dr. Nehemiah Massed: [phone]  - Dra. Icarus Partch: [phone]  - Dra. Nicole Kindred: [phone]  En caso de inclemencias del [place], por favor llame a Johnsie Kindred principal al [phone] para una actualizacin sobre el [place] de cualquier retraso o cierre.  Consejos para la medicacin en dermatologa: Por favor, guarde las cajas en las que vienen los medicamentos de uso tpico para ayudarle a seguir las instrucciones sobre dnde y cmo usarlos. Las farmacias generalmente imprimen las instrucciones del medicamento slo en las cajas y no directamente en los tubos del [place].   Si su medicamento es muy caro, por favor, pngase en contacto con Zigmund Daniel llamando al [phone] y presione la opcin 4 o envenos un mensaje a travs de Pharmacist, community.   No podemos  decirle cul ser su copago por los medicamentos por adelantado ya que esto es diferente dependiendo de la cobertura de su seguro. Sin embargo, es posible que podamos encontrar un medicamento sustituto a Electrical engineer un formulario para que el seguro cubra el medicamento que se considera necesario.   Si se requiere una autorizacin previa para que su compaa de seguros [place] su medicamento, por favor permtanos de 1 a 2 das hbiles para completar este proceso.  Los precios de los medicamentos varan con frecuencia dependiendo del Environmental consultant de dnde se surte la receta y alguna farmacias pueden ofrecer precios ms baratos.  El sitio web www.goodrx.com tiene cupones para medicamentos de Airline pilot. Los precios aqu no tienen en cuenta lo que podra costar con la ayuda del seguro (puede ser ms barato con su seguro), pero el sitio web puede darle el precio si no utiliz Research scientist (physical sciences).  - Puede imprimir el cupn correspondiente y llevarlo con su receta a la farmacia.  - Tambin puede pasar por nuestra oficina durante el horario de atencin regular y Charity fundraiser una tarjeta de cupones de GoodRx.  - Si necesita que su receta se enve electrnicamente a una farmacia diferente, informe a nuestra oficina a travs de MyChart de [place] o por telfono llamando al [phone] y presione la opcin 4.

## 2022-08-08 NOTE — Progress Notes (Signed)
Follow-Up Visit   Subjective  Sean Harvey is a 66 y.o. male who presents for the following: Annual Exam (Hx of dysplastic nevi.  New areas on back. Large SK on scalp would like frozen again).  The patient presents for Total-Body Skin Exam (TBSE) for skin cancer screening and mole check.  The patient has spots, moles and lesions to be evaluated, some may be new or changing and the patient has concerns that these could be cancer.  The following portions of the chart were reviewed this encounter and updated as appropriate:  Tobacco  Allergies  Meds  Problems  Med Hx  Surg Hx  Fam Hx      Review of Systems: No other skin or systemic complaints except as noted in HPI or Assessment and Plan.   Objective  Well appearing patient in no apparent distress; mood and affect are within normal limits.  A full examination was performed including scalp, head, eyes, ears, nose, lips, neck, chest, axillae, abdomen, back, buttocks, bilateral upper extremities, bilateral lower extremities, hands, feet, fingers, toes, fingernails, and toenails. All findings within normal limits unless otherwise noted below.  Posterior Scalp Scattered follicular based papules  Right Medial Knee 0.35 cm pink papule      Left Dorsal Forearm 0.5 cm firm eroded pink papule     Left Upper Arm - Anterior 0.6 cm pink firm papule     Left Zygoma Pink pearly papule     Left Dorsal Forearm x1 Erythematous thin papules/macules with gritty scale.   Chest Scattered pink slightly crusted papules   Assessment & Plan  Folliculitis Posterior Scalp  Patient deferred treatment at this time.   Neoplasm of skin (4) Right Medial Knee  Skin / nail biopsy Type of biopsy: tangential   Informed consent: discussed and consent obtained   Anesthesia: the lesion was anesthetized in a standard fashion   Anesthesia comment:  Area prepped with alcohol Anesthetic:  1% lidocaine w/ epinephrine 1-100,000 buffered  w/ 8.4% NaHCO3 Instrument used: flexible razor blade   Hemostasis achieved with: pressure, aluminum chloride and electrodesiccation   Outcome: patient tolerated procedure well   Post-procedure details: wound care instructions given   Post-procedure details comment:  Ointment and small bandage applied  Specimen 1 - Surgical pathology Differential Diagnosis: R/O SCC vs PG vs other  Check Margins: No  Left Dorsal Forearm  Skin / nail biopsy Type of biopsy: tangential   Informed consent: discussed and consent obtained   Anesthesia: the lesion was anesthetized in a standard fashion   Anesthesia comment:  Area prepped with alcohol Anesthetic:  1% lidocaine w/ epinephrine 1-100,000 buffered w/ 8.4% NaHCO3 Instrument used: flexible razor blade   Hemostasis achieved with: pressure, aluminum chloride and electrodesiccation   Outcome: patient tolerated procedure well   Post-procedure details: wound care instructions given   Post-procedure details comment:  Ointment and small bandage applied  Specimen 2 - Surgical pathology Differential Diagnosis: R/O SCC  Check Margins: No  Left Upper Arm - Anterior  Skin / nail biopsy Type of biopsy: tangential   Informed consent: discussed and consent obtained   Anesthesia: the lesion was anesthetized in a standard fashion   Anesthesia comment:  Area prepped with alcohol Anesthetic:  1% lidocaine w/ epinephrine 1-100,000 buffered w/ 8.4% NaHCO3 Instrument used: flexible razor blade   Hemostasis achieved with: pressure, aluminum chloride and electrodesiccation   Outcome: patient tolerated procedure well   Post-procedure details: wound care instructions given   Post-procedure details comment:  Ointment  and small bandage applied  Specimen 3 - Surgical pathology Differential Diagnosis: R/O BCC  Check Margins: No  Left Zygoma  Skin / nail biopsy Type of biopsy: tangential   Informed consent: discussed and consent obtained   Anesthesia: the  lesion was anesthetized in a standard fashion   Anesthesia comment:  Area prepped with alcohol Anesthetic:  1% lidocaine w/ epinephrine 1-100,000 buffered w/ 8.4% NaHCO3 Instrument used: flexible razor blade   Hemostasis achieved with: pressure, aluminum chloride and electrodesiccation   Outcome: patient tolerated procedure well   Post-procedure details: wound care instructions given   Post-procedure details comment:  Ointment and small bandage applied  Specimen 4 - Surgical pathology Differential Diagnosis: R/O BCC   Check Margins: No  AK (actinic keratosis) Left Dorsal Forearm x1  Actinic keratoses are precancerous spots that appear secondary to cumulative UV radiation exposure/sun exposure over time. They are chronic with expected duration over 1 year. A portion of actinic keratoses will progress to squamous cell carcinoma of the skin. It is not possible to reliably predict which spots will progress to skin cancer and so treatment is recommended to prevent development of skin cancer.  Recommend daily broad spectrum sunscreen SPF 30+ to sun-exposed areas, reapply every 2 hours as needed.  Recommend staying in the shade or wearing long sleeves, sun glasses (UVA+UVB protection) and wide brim hats (4-inch brim around the entire circumference of the hat). Call for new or changing lesions.  Destruction of lesion - Left Dorsal Forearm x1  Destruction method: cryotherapy   Informed consent: discussed and consent obtained   Lesion destroyed using liquid nitrogen: Yes   Outcome: patient tolerated procedure well with no complications   Post-procedure details: wound care instructions given   Additional details:  Prior to procedure, discussed risks of blister formation, small wound, skin dyspigmentation, or rare scar following cryotherapy. Recommend Vaseline ointment to treated areas while healing.   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 Condition is bothersome/symptomatic for patient. Currently flared.  Start Triamcinolone 0.1% cream twice daily up to 2 weeks as needed for itching. Avoid applying to face, groin, and axilla. Use as directed. Long-term use can cause thinning of the skin.  Topical steroids (such as triamcinolone, fluocinolone, fluocinonide, mometasone, clobetasol, halobetasol, betamethasone, hydrocortisone) can cause thinning and lightening of the skin if they are used for too long in the same area. Your physician has selected the right strength medicine for your problem and area affected on the body. Please use your medication only as directed by your physician to prevent side effects.   Recommend OTC Gold Bond Rapid Relief Anti-Itch cream (pramoxine + menthol), CeraVe Anti-itch cream or lotion (pramoxine), Sarna lotion (Original- menthol + camphor or Sensitive- pramoxine) or Eucerin 12 hour Itch Relief lotion (menthol) up to 3 times per day to areas on body that are itchy.     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 - No evidence of recurrence today - Recommend regular full body skin exams - Recommend daily broad spectrum sunscreen SPF 30+ to sun-exposed areas, reapply every 2 hours as needed.  - Call if any new or changing lesions are noted between office visits   History of Basal Cell Carcinoma of the Skin - No evidence of recurrence today - Recommend regular full body skin exams - Recommend daily broad  spectrum sunscreen SPF 30+ to sun-exposed areas, reapply every 2 hours as needed.  - Call if any new or changing lesions are noted between office visits  Lentigines - Scattered tan macules - Due to sun exposure - Benign-appearing, observe - Recommend daily broad spectrum sunscreen SPF 30+ to sun-exposed areas, reapply every 2 hours as needed. - Call for  any changes  Seborrheic Keratoses - Stuck-on, waxy, tan-brown papules and/or plaques  - Benign-appearing - Discussed benign etiology and prognosis. - Observe - Call for any changes  Melanocytic Nevi - Tan-brown and/or pink-flesh-colored symmetric macules and papules - Benign appearing on exam today - Observation - Call clinic for new or changing moles - Recommend daily use of broad spectrum spf 30+ sunscreen to sun-exposed areas.   Hemangiomas - Red papules - Discussed benign nature - Observe - Call for any changes  Actinic Damage - Severe, confluent actinic changes with pre-cancerous actinic keratoses  - Severe, chronic, not at goal, secondary to cumulative UV radiation exposure over time - diffuse scaly erythematous macules and papules with underlying dyspigmentation - Discussed Prescription "Field Treatment" for Severe, Chronic Confluent Actinic Changes with Pre-Cancerous Actinic Keratoses Field treatment involves treatment of an entire area of skin that has confluent Actinic Changes (Sun/ Ultraviolet light damage) and PreCancerous Actinic Keratoses by method of PhotoDynamic Therapy (PDT) and/or prescription Topical Chemotherapy agents such as 5-fluorouracil, 5-fluorouracil/calcipotriene, and/or imiquimod.  The purpose is to decrease the number of clinically evident and subclinical PreCancerous lesions to prevent progression to development of skin cancer by chemically destroying early precancer changes that may or may not be visible.  It has been shown to reduce the risk of developing skin cancer in the treated area. As a result of treatment, redness, scaling, crusting, and open sores may occur during treatment course. One or more than one of these methods may be used and may have to be used several times to control, suppress and eliminate the PreCancerous changes. Discussed treatment course, expected reaction, and possible side effects. - Recommend daily broad spectrum sunscreen SPF  30+ to sun-exposed areas, reapply every 2 hours as needed.  - Staying in the shade or wearing long sleeves, sun glasses (UVA+UVB protection) and wide brim hats (4-inch brim around the entire circumference of the hat) are also recommended. - Call for new or changing lesions.  - Start 5-fluorouracil/calcipotriene cream daily for 7 days to affected areas including lip.   Skin cancer screening performed today.   Return in about 6 months (around 02/06/2023) for TBSE.  I, Emelia Salisbury, CMA, am acting as scribe for Forest Gleason, MD.  Documentation: I have reviewed the above documentation for accuracy and completeness, and I agree with the above.  Forest Gleason, MD

## 2022-08-10 ENCOUNTER — Telehealth: Payer: Self-pay

## 2022-08-10 NOTE — Telephone Encounter (Signed)
-----   Message from Alfonso Patten, MD sent at 08/10/2022 11:11 AM EDT ----- 1. Skin , right medial knee SEBORRHEIC KERATOSIS, IRRITATED --> This is a benign growth or "wisdom spot". No additional treatment is needed.   2. Skin , left dorsal forearm WELL DIFFERENTIATED SQUAMOUS CELL CARCINOMA --> since small, recommend ED&C, schedule in the next 6 weeks if he does not have an appointment scheduled already. Will also treat ISK at scalp at this visit  3. Skin , left upper arm anterior BASAL CELL CARCINOMA, SUPERFICIAL AND NODULAR PATTERNS --> recommend ED&C given small size  4. Skin , left zygoma BASAL CELL CARCINOMA, NODULAR PATTERN --> recommend Mohs surgery  MAs please call, schedule and refer. Thank you!

## 2022-08-14 ENCOUNTER — Telehealth: Payer: Self-pay

## 2022-08-14 DIAGNOSIS — C44319 Basal cell carcinoma of skin of other parts of face: Secondary | ICD-10-CM

## 2022-08-14 NOTE — Telephone Encounter (Signed)
-----   Message from Alfonso Patten, MD sent at 08/10/2022 11:11 AM EDT ----- 1. Skin , right medial knee SEBORRHEIC KERATOSIS, IRRITATED --> This is a benign growth or "wisdom spot". No additional treatment is needed.   2. Skin , left dorsal forearm WELL DIFFERENTIATED SQUAMOUS CELL CARCINOMA --> since small, recommend ED&C, schedule in the next 6 weeks if he does not have an appointment scheduled already. Will also treat ISK at scalp at this visit  3. Skin , left upper arm anterior BASAL CELL CARCINOMA, SUPERFICIAL AND NODULAR PATTERNS --> recommend ED&C given small size  4. Skin , left zygoma BASAL CELL CARCINOMA, NODULAR PATTERN --> recommend Mohs surgery  MAs please call, schedule and refer. Thank you!

## 2022-08-14 NOTE — Addendum Note (Signed)
Addended by: Dicie Beam B on: 08/14/2022 03:50 PM   Modules accepted: Orders

## 2022-08-14 NOTE — Telephone Encounter (Signed)
Patient called back and notified of bx results. [facility] appointment made and referral sent to Dr. Thornton Park office at [facility].

## 2022-08-20 ENCOUNTER — Encounter: Payer: Self-pay | Admitting: Dermatology

## 2022-08-31 ENCOUNTER — Telehealth: Payer: Self-pay

## 2022-08-31 NOTE — Telephone Encounter (Signed)
Reviewed. Thank you.

## 2022-08-31 NOTE — Telephone Encounter (Signed)
Encounter notes from patients visit to Dr. Leory Plowman [place] office at [facility] scanned in under the MEDIA tab for your review.

## 2022-09-05 ENCOUNTER — Ambulatory Visit (INDEPENDENT_AMBULATORY_CARE_PROVIDER_SITE_OTHER): Admitting: Dermatology

## 2022-09-05 ENCOUNTER — Encounter: Payer: Self-pay | Admitting: Dermatology

## 2022-09-05 DIAGNOSIS — C44619 Basal cell carcinoma of skin of left upper limb, including shoulder: Secondary | ICD-10-CM | POA: Diagnosis not present

## 2022-09-05 DIAGNOSIS — L820 Inflamed seborrheic keratosis: Secondary | ICD-10-CM | POA: Diagnosis not present

## 2022-09-05 DIAGNOSIS — Z872 Personal history of diseases of the skin and subcutaneous tissue: Secondary | ICD-10-CM | POA: Diagnosis not present

## 2022-09-05 DIAGNOSIS — C4492 Squamous cell carcinoma of skin, unspecified: Secondary | ICD-10-CM

## 2022-09-05 NOTE — Progress Notes (Signed)
Follow-Up Visit   Subjective  Sean Harvey is a 67 y.o. male who presents for the following: Procedure (Patient here today for [facility]).   The following portions of the chart were reviewed this encounter and updated as appropriate:   Tobacco  Allergies  Meds  Problems  Med Hx  Surg Hx  Fam Hx      Review of Systems:  No other skin or systemic complaints except as noted in HPI or Assessment and Plan.  Objective  Well appearing patient in no apparent distress; mood and affect are within normal limits.  A focused examination was performed including arms, scalp. Relevant physical exam findings are noted in the Assessment and Plan.  Left Upper Arm Anterior Pink papule  Left Dorsal Forearm Pink papule  right occipital scalp Erythematous stuck-on, waxy papule or plaque    Assessment & Plan  Basal cell carcinoma (BCC) of skin of left upper extremity including shoulder Left Upper Arm Anterior  Destruction of lesion  Destruction method: electrodesiccation and curettage   Informed consent: discussed and consent obtained   Timeout:  patient name, date of birth, surgical site, and procedure verified Anesthesia: the lesion was anesthetized in a standard fashion   Anesthetic:  1% lidocaine w/ epinephrine 1-100,000 buffered w/ 8.4% NaHCO3 Curettage performed in three different directions: Yes   Electrodesiccation performed over the curetted area: Yes   Curettage cycles:  3 Final wound size (cm):  0.8 Hemostasis achieved with:  electrodesiccation Outcome: patient tolerated procedure well with no complications   Post-procedure details: sterile dressing applied and wound care instructions given   Dressing type: petrolatum    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  Destruction of lesion  Destruction method: electrodesiccation and curettage   Informed consent: discussed and consent obtained   Timeout:  patient name, date of birth, surgical site, and procedure  verified Anesthesia: the lesion was anesthetized in a standard fashion   Anesthetic:  1% lidocaine w/ epinephrine 1-100,000 buffered w/ 8.4% NaHCO3 Curettage performed in three different directions: Yes   Electrodesiccation performed over the curetted area: Yes   Curettage cycles:  3 Final wound size (cm):  0.9 Hemostasis achieved with:  electrodesiccation Outcome: patient tolerated procedure well with no complications   Post-procedure details: sterile dressing applied and wound care instructions given   Dressing type: petrolatum    Inflamed seborrheic keratosis right occipital scalp  Symptomatic, irritating, patient would like treated.  Has recurred with cryotherapy. Pt prefers electrodesiccation and light curettage. Reviewed risk of scar, expected wound.  Destruction of lesion - right occipital scalp  Destruction method: electrodesiccation and curettage   Informed consent: discussed and consent obtained   Timeout:  patient name, date of birth, surgical site, and procedure verified Anesthesia: the lesion was anesthetized in a standard fashion   Anesthetic:  1% lidocaine w/ epinephrine 1-100,000 buffered w/ 8.4% NaHCO3 Hemostasis achieved with:  electrodesiccation Outcome: patient tolerated procedure well with no complications   Post-procedure details: sterile dressing applied and wound care instructions given   Dressing type: petrolatum    History of PreCancerous Actinic Keratosis  - site(s) of PreCancerous Actinic Keratosis clear today at lower lip. - these may recur and new lesions may form requiring treatment to prevent transformation into skin cancer - observe for new or changing spots and contact [place] for appointment if occur - photoprotection with sun protective clothing; sunglasses and broad spectrum sunscreen with SPF of at least 30 + and frequent self skin exams recommended -  yearly exams by a dermatologist recommended for persons with history of  PreCancerous Actinic Keratoses   Return for TBSE, as scheduled.  Graciella Belton, RMA, am acting as scribe for Forest Gleason, MD .  Documentation: I have reviewed the above documentation for accuracy and completeness, and I agree with the above.  Forest Gleason, MD

## 2022-09-05 NOTE — Patient Instructions (Addendum)
Wound Care Instructions  Cleanse wound gently with soap and water once a day then pat dry with clean gauze. Apply a thin coat of Petrolatum (petroleum jelly, "Vaseline") over the wound (unless you have an allergy to this). We recommend that you use a new, sterile tube of Vaseline. Do not pick or remove scabs. Do not remove the yellow or white "healing tissue" from the base of the wound.  Cover the wound with fresh, clean, nonstick gauze and secure with paper tape. You may use Band-Aids in place of gauze and tape if the wound is small enough, but would recommend trimming much of the tape off as there is often too much. Sometimes Band-Aids can irritate the skin.  You should call the office for your biopsy report after 1 week if you have not already been contacted.  If you experience any problems, such as abnormal amounts of bleeding, swelling, significant bruising, significant pain, or evidence of infection, please call the office immediately.  FOR ADULT SURGERY PATIENTS: If you need something for pain relief you may take 1 extra strength Tylenol (acetaminophen) AND 2 Ibuprofen (200mg each) together every 4 hours as needed for pain. (do not take these if you are allergic to them or if you have a reason you should not take them.) Typically, you may only need pain medication for 1 to 3 days.     Due to recent changes in healthcare laws, you may see results of your pathology and/or laboratory studies on MyChart before the doctors have had a chance to review them. We understand that in some cases there may be results that are confusing or concerning to you. Please understand that not all results are received at the same time and often the doctors may need to interpret multiple results in order to provide you with the best plan of care or course of treatment. Therefore, we ask that you please give us 2 business days to thoroughly review all your results before contacting the office for clarification. Should  we see a critical lab result, you will be contacted sooner.   If You Need Anything After Your Visit  If you have any questions or concerns for your doctor, please call our main line at [phone] and press option 4 to reach your doctor's medical assistant. If no one answers, please leave a voicemail as directed and we will return your call as soon as possible. Messages left after 4 pm will be answered the following business day.   You may also send us a message via MyChart. We typically respond to MyChart messages within 1-2 business days.  For prescription refills, please ask your pharmacy to contact our office. Our fax number is [phone].  If you have an urgent issue when the clinic is closed that cannot wait until the next business day, you can page your doctor at the number below.    Please note that while we do our best to be available for urgent issues outside of office hours, we are not available 24/7.   If you have an urgent issue and are unable to reach us, you may choose to seek medical care at your doctor's office, retail clinic, urgent care center, or emergency room.  If you have a medical emergency, please immediately call 911 or go to the emergency department.  Pager Numbers  - Dr. Kowalski: [phone]  - Dr. Moye: [phone]  - Dr. Stewart: [phone]  In the event of inclement weather, please call our main line at   [phone] for an update on the status of any delays or closures.  Dermatology Medication Tips: Please keep the boxes that topical medications come in in order to help keep track of the instructions about where and how to use these. Pharmacies typically print the medication instructions only on the boxes and not directly on the medication tubes.   If your medication is too expensive, please contact our office at [phone] option 4 or send us a message through MyChart.   We are unable to tell what your co-pay for medications will be in  advance as this is different depending on your insurance coverage. However, we may be able to find a substitute medication at lower cost or fill out paperwork to get insurance to cover a needed medication.   If a prior authorization is required to get your medication covered by your insurance company, please allow us 1-2 business days to complete this process.  Drug prices often vary depending on where the prescription is filled and some pharmacies may offer cheaper prices.  The website www.goodrx.com contains coupons for medications through different pharmacies. The prices here do not account for what the cost may be with help from insurance (it may be cheaper with your insurance), but the website can give you the price if you did not use any insurance.  - You can print the associated coupon and take it with your prescription to the pharmacy.  - You may also stop by our office during regular business hours and pick up a GoodRx coupon card.  - If you need your prescription sent electronically to a different pharmacy, notify our office through [facility] MyChart or by phone at [phone] option 4.     Si Usted Necesita Algo Despus de Su Visita  Tambin puede enviarnos un mensaje a travs de MyChart. Por lo general respondemos a los mensajes de MyChart en el transcurso de 1 a 2 das hbiles.  Para renovar recetas, por favor pida a su farmacia que se ponga en contacto con nuestra oficina. Nuestro nmero de fax es el [phone].  Si tiene un asunto urgente cuando la clnica est cerrada y que no puede esperar hasta el siguiente da hbil, puede llamar/localizar a su doctor(a) al nmero que aparece a continuacin.   Por favor, tenga en cuenta que aunque hacemos todo lo posible para estar disponibles para asuntos urgentes fuera del horario de oficina, no estamos disponibles las 24 horas del da, los 7 das de la semana.   Si tiene un problema urgente y no puede comunicarse con nosotros, puede  optar por buscar atencin mdica  en el consultorio de su doctor(a), en una clnica privada, en un centro de atencin urgente o en una sala de emergencias.  Si tiene una emergencia mdica, por favor llame inmediatamente al 911 o vaya a la sala de emergencias.  Nmeros de bper  - Dr. Kowalski: [phone]  - Dra. Moye: [phone]  - Dra. Stewart: [phone]  En caso de inclemencias del tiempo, por favor llame a nuestra lnea principal al [phone] para una actualizacin sobre el estado de cualquier retraso o cierre.  Consejos para la medicacin en dermatologa: Por favor, guarde las cajas en las que vienen los medicamentos de uso tpico para ayudarle a seguir las instrucciones sobre dnde y cmo usarlos. Las farmacias generalmente imprimen las instrucciones del medicamento slo en las cajas y no directamente en los tubos del medicamento.   Si su medicamento es muy caro, por favor, pngase en contacto con   nuestra oficina llamando al [phone] y presione la opcin 4 o envenos un mensaje a travs de MyChart.   No podemos decirle cul ser su copago por los medicamentos por adelantado ya que esto es diferente dependiendo de la cobertura de su seguro. Sin embargo, es posible que podamos encontrar un medicamento sustituto a menor costo o llenar un formulario para que el seguro cubra el medicamento que se considera necesario.   Si se requiere una autorizacin previa para que su compaa de seguros cubra su medicamento, por favor permtanos de 1 a 2 das hbiles para completar este proceso.  Los precios de los medicamentos varan con frecuencia dependiendo del lugar de dnde se surte la receta y alguna farmacias pueden ofrecer precios ms baratos.  El sitio web www.goodrx.com tiene cupones para medicamentos de diferentes farmacias. Los precios aqu no tienen en cuenta lo que podra costar con la ayuda del seguro (puede ser ms barato con su seguro), pero el sitio web puede darle el  precio si no utiliz ningn seguro.  - Puede imprimir el cupn correspondiente y llevarlo con su receta a la farmacia.  - Tambin puede pasar por nuestra oficina durante el horario de atencin regular y recoger una tarjeta de cupones de GoodRx.  - Si necesita que su receta se enve electrnicamente a una farmacia diferente, informe a nuestra oficina a travs de MyChart de [facility] o por telfono llamando al [phone] y presione la opcin 4.  

## 2022-09-18 ENCOUNTER — Telehealth: Payer: Self-pay

## 2022-09-18 ENCOUNTER — Encounter: Payer: Self-pay | Admitting: Dermatology

## 2022-09-18 NOTE — Telephone Encounter (Signed)
History and specimen tracking updated per MOHs progress note. 

## 2023-02-08 ENCOUNTER — Ambulatory Visit: Payer: Medicare Other | Admitting: Dermatology

## 2023-02-08 VITALS — BP 126/69 | HR 56

## 2023-02-08 DIAGNOSIS — L821 Other seborrheic keratosis: Secondary | ICD-10-CM

## 2023-02-08 DIAGNOSIS — L820 Inflamed seborrheic keratosis: Secondary | ICD-10-CM

## 2023-02-08 DIAGNOSIS — Z85828 Personal history of other malignant neoplasm of skin: Secondary | ICD-10-CM | POA: Diagnosis not present

## 2023-02-08 DIAGNOSIS — L111 Transient acantholytic dermatosis [Grover]: Secondary | ICD-10-CM | POA: Diagnosis not present

## 2023-02-08 DIAGNOSIS — D229 Melanocytic nevi, unspecified: Secondary | ICD-10-CM

## 2023-02-08 DIAGNOSIS — L814 Other melanin hyperpigmentation: Secondary | ICD-10-CM

## 2023-02-08 DIAGNOSIS — Z1283 Encounter for screening for malignant neoplasm of skin: Secondary | ICD-10-CM

## 2023-02-08 DIAGNOSIS — L570 Actinic keratosis: Secondary | ICD-10-CM | POA: Diagnosis not present

## 2023-02-08 DIAGNOSIS — Z86018 Personal history of other benign neoplasm: Secondary | ICD-10-CM

## 2023-02-08 DIAGNOSIS — L578 Other skin changes due to chronic exposure to nonionizing radiation: Secondary | ICD-10-CM

## 2023-02-08 MED ORDER — TRIAMCINOLONE ACETONIDE 0.1 % EX CREA: TOPICAL_CREAM | CUTANEOUS | 2 refills | Status: AC

## 2023-02-08 NOTE — Progress Notes (Signed)
Follow-Up Visit   Subjective  Sean Harvey is a 67 y.o. male who presents for the following: TBSE (The patient presents for Total-Body Skin Exam (TBSE) for skin cancer screening and mole check.  The patient has spots, moles and lesions to be evaluated, some may be new or changing and the patient has concerns that these could be cancer. Patient with hx of SCC, BCC and atypical nevi. ).  Patient does have an itchy spot at left shoulder.   The following portions of the chart were reviewed this encounter and updated as appropriate:   Tobacco  Allergies  Meds  Problems  Med Hx  Surg Hx  Fam Hx      Review of Systems:  No other skin or systemic complaints except as noted in HPI or Assessment and Plan.  Objective  Well appearing patient in no apparent distress; mood and affect are within normal limits.  A full examination was performed including scalp, head, eyes, ears, nose, lips, neck, chest, axillae, abdomen, back, buttocks, bilateral upper extremities, bilateral lower extremities, hands, feet, fingers, toes, fingernails, and toenails. All findings within normal limits unless otherwise noted below.  left shoulder x 1, low mid back x 1 (2) Erythematous stuck-on, waxy papule or plaque  abdomen Slightly crusted scattered red papules at chest  R helix x 1, L lateral canthus x 1 (2) Erythematous thin papules/macules with gritty scale.     Assessment & Plan  Inflamed seborrheic keratosis (2) left shoulder x 1, low mid back x 1  Symptomatic, irritating, patient would like treated.  Benign-appearing.  Call clinic for new or changing lesions.   Prior to procedure, discussed risks of blister formation, small wound, skin dyspigmentation, or rare scar following treatment. Recommend Vaseline ointment to treated areas while healing.    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 Condition is bothersome/symptomatic for patient. Currently flared.  Start TMC 0.1% cream twice daily for up to 2 weeks as needed for rash. Avoid applying to face, groin, and axilla. Use as directed. Long-term use can cause thinning of the skin.  Topical steroids (such as triamcinolone, fluocinolone, fluocinonide, mometasone, clobetasol, halobetasol, betamethasone, hydrocortisone) can cause thinning and lightening of the skin if they are used for too long in the same area. Your physician has selected the right strength medicine for your problem and area affected on the body. Please use your medication only as directed by your physician to prevent side effects.   Recommend OTC Gold Bond Rapid Relief Anti-Itch cream (pramoxine + menthol), CeraVe Anti-itch cream or lotion (pramoxine), Sarna lotion (Original- menthol + camphor or Sensitive- pramoxine) or Eucerin 12 hour Itch Relief lotion (menthol) up to 3 times per day to areas on body that are itchy.  Reviewed no cure, only treatment when more bothersome   triamcinolone cream (KENALOG) 0.1 % - abdomen Apply twice daily to affected areas of rash for up to 2 weeks. Avoid applying to face, groin, and axilla. Use as directed. Long-term use can cause thinning of the skin.  AK (actinic keratosis) (2) R helix x 1, L lateral canthus  x 1  Actinic keratoses are precancerous spots that appear secondary to cumulative UV radiation exposure/sun exposure over time. They are chronic with expected duration over 1 year. A portion of actinic keratoses will progress to squamous cell carcinoma of the skin. It is not possible to reliably  predict which spots will progress to skin cancer and so treatment is recommended to prevent development of skin cancer.  Recommend daily broad spectrum sunscreen SPF 30+ to sun-exposed areas, reapply every 2 hours as needed.  Recommend staying in the shade or wearing long sleeves, sun glasses (UVA+UVB protection) and wide brim hats (4-inch brim around the entire circumference of the hat). Call for new or changing lesions.  Prior to procedure, discussed risks of blister formation, small wound, skin dyspigmentation, or rare scar following cryotherapy. Recommend Vaseline ointment to treated areas while healing.   Destruction of lesion - R helix x 1, L lateral canthus x 1  Destruction method: cryotherapy   Informed consent: discussed and consent obtained   Lesion destroyed using liquid nitrogen: Yes   Cryotherapy cycles:  2 Outcome: patient tolerated procedure well with no complications   Post-procedure details: wound care instructions given    Related Medications fluorouracil (EFUDEX) 5 % cream Apply twice daily to lip for 7 days   History of Basal Cell Carcinoma of the Skin - No evidence of recurrence today - Recommend regular full body skin exams - Recommend daily broad spectrum sunscreen SPF 30+ to sun-exposed areas, reapply every 2 hours as needed.  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  - Call if any new or changing lesions are noted between office visits  History of Squamous Cell Carcinoma of the Skin - No evidence of recurrence today - No lymphadenopathy - Recommend regular full body skin exams - Recommend daily broad spectrum sunscreen SPF 30+ to sun-exposed areas, reapply every 2 hours as needed.  - Call if any new or  changing lesions are noted between office visits  Lentigines - Scattered tan macules - Due to sun exposure - Benign-appearing, observe - Recommend daily broad spectrum sunscreen SPF 30+ to sun-exposed areas, reapply every 2 hours as needed. - Call for any changes  Seborrheic Keratoses - Stuck-on, waxy, tan-brown papules and/or plaques  - Benign-appearing - Discussed benign etiology and prognosis. - Observe - Call for any changes  Melanocytic Nevi - Tan-brown and/or pink-flesh-colored symmetric macules and papules - Benign appearing on exam today - Observation - Call clinic for new or changing moles - Recommend daily use of broad spectrum spf 30+ sunscreen to sun-exposed areas.   Hemangiomas - Red papules - Discussed benign nature - Observe - Call for any changes  Actinic Damage - Chronic condition, secondary to cumulative UV/sun exposure - diffuse scaly erythematous macules with underlying dyspigmentation - Recommend daily broad spectrum sunscreen SPF 30+ to sun-exposed areas, reapply every 2 hours as needed.  - Staying in the shade or wearing long sleeves, sun glasses (UVA+UVB protection) and wide brim hats (4-inch brim around the entire circumference of the hat) are also recommended for sun protection.  - Call for new or changing lesions.  Skin cancer screening performed today.  Return in about 6 months (around 08/11/2023) for TBSE, Hx SCC, Hx BCC, Hx Dysplastic  Nevi.  Graciella Belton, RMA, am acting as scribe for Forest Gleason, MD .  Documentation: I have reviewed the above documentation for accuracy and completeness, and I agree with the above.  Forest Gleason, MD

## 2023-02-08 NOTE — Patient Instructions (Addendum)
Cryotherapy Aftercare  Wash gently with soap and water everyday.   Apply Vaseline and Band-Aid daily until healed.   Start triamcinolone 0.1% cream twice daily for up to 2 weeks as needed for rash. Avoid applying to face, groin, and axilla. Use as directed. Long-term use can cause thinning of the skin.  Topical steroids (such as triamcinolone, fluocinolone, fluocinonide, mometasone, clobetasol, halobetasol, betamethasone, hydrocortisone) can cause thinning and lightening of the skin if they are used for too long in the same area. Your physician has selected the right strength medicine for your problem and area affected on the body. Please use your medication only as directed by your physician to prevent side effects.   Recommend OTC Gold Bond Rapid Relief Anti-Itch cream (pramoxine + menthol), CeraVe Anti-itch cream or lotion (pramoxine), Sarna lotion (Original- menthol + camphor or Sensitive- pramoxine) or Eucerin 12 hour Itch Relief lotion (menthol) up to 3 times per day to areas on body that are itchy.   Recommend taking Heliocare sun protection supplement daily in sunny weather for additional sun protection. For maximum protection on the sunniest days, you can take up to 2 capsules of regular Heliocare OR take 1 capsule of Heliocare Ultra. For prolonged exposure (such as a full day in the sun), you can repeat your dose of the supplement 4 hours after your first dose. Heliocare can be purchased at [employer], at some [facility] or at [email or web address].    Melanoma ABCDEs  Melanoma is the most dangerous type of skin cancer, and is the leading cause of death from skin disease.  You are more likely to develop melanoma if you: Have light-colored skin, light-colored eyes, or red or blond hair Spend a lot of time in the sun Tan regularly, either outdoors or in a tanning bed Have had blistering sunburns, especially during childhood Have a close family member who has had a melanoma Have  atypical moles or large birthmarks  Early detection of melanoma is key since treatment is typically straightforward and cure rates are extremely high if we catch it early.   The first sign of melanoma is often a change in a mole or a new dark spot.  The ABCDE system is a way of remembering the signs of melanoma.  A for asymmetry:  The two halves do not match. B for border:  The edges of the growth are irregular. C for color:  A mixture of colors are present instead of an even brown color. D for diameter:  Melanomas are usually (but not always) greater than 27m - the size of a pencil eraser. E for evolution:  The spot keeps changing in size, shape, and color.  Please check your skin once per month between visits. You can use a small mirror in front and a large mirror behind you to keep an eye on the back side or your body.   If you see any new or changing lesions before your next follow-up, please call to schedule a visit.  Please continue daily skin protection including broad spectrum sunscreen SPF 30+ to sun-exposed areas, reapplying every 2 hours as needed when you're outdoors.    Due to recent changes in healthcare laws, you may see results of your pathology and/or laboratory studies on MyChart before the doctors have had a chance to review them. We understand that in some cases there may be results that are confusing or concerning to you. Please understand that not all results are received at the same time and often the  doctors may need to interpret multiple results in order to provide you with the best plan of care or course of treatment. Therefore, we ask that you please give [place] 2 business days to thoroughly review all your results before contacting the office for clarification. Should we see a critical lab result, you will be contacted sooner.   If You Need Anything After Your Visit  If you have any questions or concerns for your doctor, please call our main line at [phone] and  press option 4 to reach your doctor's medical assistant. If no one answers, please leave a voicemail as directed and we will return your call as soon as possible. Messages left after 4 pm will be answered the following business day.   You may also send [place] a message via [place]. We typically respond to MyChart messages within 1-2 business days.  For prescription refills, please ask your pharmacy to contact our office. Our fax number is [phone].  If you have an urgent issue when the clinic is closed that cannot wait until the next business day, you can page your doctor at the number below.    Please note that while we do our best to be available for urgent issues outside of office hours, we are not available 24/7.   If you have an urgent issue and are unable to reach [place], you may choose to seek medical care at your doctor's office, retail clinic, urgent care center, or emergency room.  If you have a medical emergency, please immediately call 911 or go to the emergency department.  Pager Numbers  - Dr. Nehemiah Massed: [phone]  - Dr. Laurence Ferrari: [phone]  - Dr. Nicole Kindred: [phone]  In the event of inclement weather, please call our main line at [phone] for an update on the status of any delays or closures.  Dermatology Medication Tips: Please keep the boxes that topical medications come in in order to help keep track of the instructions about where and how to use these. Pharmacies typically print the medication instructions only on the boxes and not directly on the medication tubes.   If your medication is too expensive, please contact our office at [phone] option 4 or send [place] a message through [place].   We are unable to tell what your co-pay for medications will be in advance as this is different depending on your insurance coverage. However, we may be able to find a substitute medication at lower cost or fill out paperwork to get insurance to cover a needed medication.   If  a prior authorization is required to get your medication covered by your insurance company, please allow [place] 1-2 business days to complete this process.  Drug prices often vary depending on where the prescription is filled and some pharmacies may offer cheaper prices.  The website www.goodrx.com contains coupons for medications through different pharmacies. The prices here do not account for what the cost may be with help from insurance (it may be cheaper with your insurance), but the website can give you the price if you did not use any insurance.  - You can print the associated coupon and take it with your prescription to the pharmacy.  - You may also stop by our office during regular business hours and pick up a GoodRx coupon card.  - If you need your prescription sent electronically to a different pharmacy, notify our office through [facility] or by phone at [phone] option 4.     Si Usted [employer]  Despus de Su Visita  Tambin puede enviarnos un mensaje a travs de MyChart. Por lo general respondemos a los mensajes de MyChart en el transcurso de 1 a 2 das hbiles.  Para renovar recetas, por favor pida a su farmacia que se ponga en contacto con nuestra oficina. Harland Dingwall de fax es [place] [phone].  Si tiene un asunto urgente cuando la clnica est cerrada y que no puede esperar hasta el siguiente da hbil, puede llamar/localizar a su doctor(a) al nmero que aparece a continuacin.   Por favor, tenga en cuenta que aunque hacemos todo lo posible para estar disponibles para asuntos urgentes fuera del horario de [place], no estamos disponibles las 24 horas del da, los 7 das de la [place].   Si tiene un problema urgente y no puede comunicarse con nosotros, puede optar por buscar atencin mdica  en el consultorio de su doctor(a), en una clnica privada, en un centro de atencin urgente o en una sala de emergencias.  Si tiene Engineering geologist, por favor llame  inmediatamente al 911 o vaya a la sala de emergencias.  Nmeros de bper  - Dr. Nehemiah Massed: [phone]  - Dra. Moye: [phone]  - Dra. Nicole Kindred: [phone]  En caso de inclemencias del [place], por favor llame a Johnsie Kindred principal al [phone] para una actualizacin sobre el [place] de cualquier retraso o cierre.  Consejos para la medicacin en dermatologa: Por favor, guarde las cajas en las que vienen los medicamentos de uso tpico para ayudarle a seguir las instrucciones sobre dnde y cmo usarlos. Las farmacias generalmente imprimen las instrucciones del medicamento slo en las cajas y no directamente en los tubos del [place].   Si su medicamento es muy caro, por favor, pngase en contacto con Zigmund Daniel llamando al [phone] y presione la opcin 4 o envenos un mensaje a travs de Pharmacist, community.   No podemos decirle cul ser su copago por los medicamentos por adelantado ya que esto es diferente dependiendo de la cobertura de su seguro. Sin embargo, es posible que podamos encontrar un medicamento sustituto a Electrical engineer un formulario para que el seguro cubra el medicamento que se considera necesario.   Si se requiere una autorizacin previa para que su compaa de seguros [place] su medicamento, por favor permtanos de 1 a 2 das hbiles para completar este proceso.  Los precios de los medicamentos varan con frecuencia dependiendo del Environmental consultant de dnde se surte la receta y alguna farmacias pueden ofrecer precios ms baratos.  El sitio web www.goodrx.com tiene cupones para medicamentos de Airline pilot. Los precios aqu no tienen en cuenta lo que podra costar con la ayuda del seguro (puede ser ms barato con su seguro), pero el sitio web puede darle el precio si no utiliz Research scientist (physical sciences).  - Puede imprimir el cupn correspondiente y llevarlo con su receta a la farmacia.  - Tambin puede pasar por nuestra oficina durante el horario de atencin regular y Charity fundraiser  una tarjeta de cupones de GoodRx.  - Si necesita que su receta se enve electrnicamente a una farmacia diferente, informe a nuestra oficina a travs de MyChart de [place] o por telfono llamando al [phone] y presione la opcin 4.

## 2023-02-11 ENCOUNTER — Encounter: Payer: Self-pay | Admitting: Dermatology

## 2023-03-14 DIAGNOSIS — K743 Primary biliary cirrhosis: Secondary | ICD-10-CM | POA: Insufficient documentation

## 2023-08-16 ENCOUNTER — Ambulatory Visit: Payer: Medicare Other | Admitting: Dermatology

## 2023-08-16 ENCOUNTER — Encounter: Payer: Self-pay | Admitting: Dermatology

## 2023-08-16 ENCOUNTER — Encounter: Payer: Medicare Other | Admitting: Dermatology

## 2023-08-16 DIAGNOSIS — L814 Other melanin hyperpigmentation: Secondary | ICD-10-CM

## 2023-08-16 DIAGNOSIS — H61002 Unspecified perichondritis of left external ear: Secondary | ICD-10-CM | POA: Diagnosis not present

## 2023-08-16 DIAGNOSIS — Z86018 Personal history of other benign neoplasm: Secondary | ICD-10-CM

## 2023-08-16 DIAGNOSIS — N400 Enlarged prostate without lower urinary tract symptoms: Secondary | ICD-10-CM | POA: Insufficient documentation

## 2023-08-16 DIAGNOSIS — Z85828 Personal history of other malignant neoplasm of skin: Secondary | ICD-10-CM

## 2023-08-16 DIAGNOSIS — D1801 Hemangioma of skin and subcutaneous tissue: Secondary | ICD-10-CM

## 2023-08-16 DIAGNOSIS — L570 Actinic keratosis: Secondary | ICD-10-CM

## 2023-08-16 DIAGNOSIS — L821 Other seborrheic keratosis: Secondary | ICD-10-CM

## 2023-08-16 DIAGNOSIS — D492 Neoplasm of unspecified behavior of bone, soft tissue, and skin: Secondary | ICD-10-CM | POA: Diagnosis not present

## 2023-08-16 DIAGNOSIS — D485 Neoplasm of uncertain behavior of skin: Secondary | ICD-10-CM

## 2023-08-16 DIAGNOSIS — W908XXA Exposure to other nonionizing radiation, initial encounter: Secondary | ICD-10-CM | POA: Diagnosis not present

## 2023-08-16 DIAGNOSIS — Z1283 Encounter for screening for malignant neoplasm of skin: Secondary | ICD-10-CM | POA: Diagnosis not present

## 2023-08-16 DIAGNOSIS — L820 Inflamed seborrheic keratosis: Secondary | ICD-10-CM

## 2023-08-16 DIAGNOSIS — L578 Other skin changes due to chronic exposure to nonionizing radiation: Secondary | ICD-10-CM

## 2023-08-16 DIAGNOSIS — D229 Melanocytic nevi, unspecified: Secondary | ICD-10-CM

## 2023-08-16 DIAGNOSIS — I82409 Acute embolism and thrombosis of unspecified deep veins of unspecified lower extremity: Secondary | ICD-10-CM | POA: Insufficient documentation

## 2023-08-16 NOTE — Patient Instructions (Signed)
Cryotherapy Aftercare  Wash gently with soap and water everyday.   Apply Vaseline and Band-Aid daily until healed.   Wound Care Instructions  Cleanse wound gently with soap and water once a day then pat dry with clean gauze. Apply a thin coat of Petrolatum (petroleum jelly, "Vaseline") over the wound (unless you have an allergy to this). We recommend that you use a new, sterile tube of Vaseline. Do not pick or remove scabs. Do not remove the yellow or white "healing tissue" from the base of the wound.  Cover the wound with fresh, clean, nonstick gauze and secure with paper tape. You may use Band-Aids in place of gauze and tape if the wound is small enough, but would recommend trimming much of the tape off as there is often too much. Sometimes Band-Aids can irritate the skin.  You should call the office for your biopsy report after 1 week if you have not already been contacted.  If you experience any problems, such as abnormal amounts of bleeding, swelling, significant bruising, significant pain, or evidence of infection, please call the office immediately.  FOR ADULT SURGERY PATIENTS: If you need something for pain relief you may take 1 extra strength Tylenol (acetaminophen) AND 2 Ibuprofen (200mg  each) together every 4 hours as needed for pain. (do not take these if you are allergic to them or if you have a reason you should not take them.) Typically, you may only need pain medication for 1 to 3 days.   Melanoma ABCDEs  Melanoma is the most dangerous type of skin cancer, and is the leading cause of death from skin disease.  You are more likely to develop melanoma if you: Have light-colored skin, light-colored eyes, or red or blond hair Spend a lot of time in the sun Tan regularly, either outdoors or in a tanning bed Have had blistering sunburns, especially during childhood Have a close family member who has had a melanoma Have atypical moles or large birthmarks  Early detection of  melanoma is key since treatment is typically straightforward and cure rates are extremely high if we catch it early.   The first sign of melanoma is often a change in a mole or a new dark spot.  The ABCDE system is a way of remembering the signs of melanoma.  A for asymmetry:  The two halves do not match. B for border:  The edges of the growth are irregular. C for color:  A mixture of colors are present instead of an even brown color. D for diameter:  Melanomas are usually (but not always) greater than 6mm - the size of a pencil eraser. E for evolution:  The spot keeps changing in size, shape, and color.  Please check your skin once per month between visits. You can use a small mirror in front and a large mirror behind you to keep an eye on the back side or your body.   If you see any new or changing lesions before your next follow-up, please call to schedule a visit.  Please continue daily skin protection including broad spectrum sunscreen SPF 30+ to sun-exposed areas, reapplying every 2 hours as needed when you're outdoors.    Due to recent changes in healthcare laws, you may see results of your pathology and/or laboratory studies on MyChart before the doctors have had a chance to review them. We understand that in some cases there may be results that are confusing or concerning to you. Please understand that not all results  are received at the same time and often the doctors may need to interpret multiple results in order to provide you with the best plan of care or course of treatment. Therefore, we ask that you please give [place] 2 business days to thoroughly review all your results before contacting the office for clarification. Should we see a critical lab result, you will be contacted sooner.   If You Need Anything After Your Visit  If you have any questions or concerns for your doctor, please call our main line at [phone] and press option 4 to reach your doctor's medical assistant. If  no one answers, please leave a voicemail as directed and we will return your call as soon as possible. Messages left after 4 pm will be answered the following business day.   You may also send [place] a message via MyChart. We typically respond to MyChart messages within 1-2 business days.  For prescription refills, please ask your pharmacy to contact our office. Our fax number is [phone].  If you have an urgent issue when the clinic is closed that cannot wait until the next business day, you can page your doctor at the number below.    Please note that while we do our best to be available for urgent issues outside of office hours, we are not available 24/7.   If you have an urgent issue and are unable to reach [place], you may choose to seek medical care at your doctor's office, retail clinic, urgent care center, or emergency room.  If you have a medical emergency, please immediately call 911 or go to the emergency department.  Pager Numbers  - Dr. Gwen Pounds: [phone]  - Dr. Roseanne Reno: [phone]  - Dr. Katrinka Blazing: [phone]   In the event of inclement weather, please call our main line at [phone] for an update on the status of any delays or closures.  Dermatology Medication Tips: Please keep the boxes that topical medications come in in order to help keep track of the instructions about where and how to use these. Pharmacies typically print the medication instructions only on the boxes and not directly on the medication tubes.   If your medication is too expensive, please contact our office at [phone] option 4 or send [place] a message through MyChart.   We are unable to tell what your co-pay for medications will be in advance as this is different depending on your insurance coverage. However, we may be able to find a substitute medication at lower cost or fill out paperwork to get insurance to cover a needed medication.   If a prior authorization is required to get your medication  covered by your insurance company, please allow [place] 1-2 business days to complete this process.  Drug prices often vary depending on where the prescription is filled and some pharmacies may offer cheaper prices.  The website www.goodrx.com contains coupons for medications through different pharmacies. The prices here do not account for what the cost may be with help from insurance (it may be cheaper with your insurance), but the website can give you the price if you did not use any insurance.  - You can print the associated coupon and take it with your prescription to the pharmacy.  - You may also stop by our office during regular business hours and pick up a GoodRx coupon card.  - If you need your prescription sent electronically to a different pharmacy, notify our office through [facility] or by phone at [phone]  option 4.

## 2023-08-16 NOTE — Progress Notes (Signed)
Follow-Up Visit   Subjective  Sean Harvey is a 67 y.o. male who presents for the following: Skin Cancer Screening and Full Body Skin Exam  The patient presents for Total-Body Skin Exam (TBSE) for skin cancer screening and mole check. The patient has spots, moles and lesions to be evaluated, some may be new or changing and the patient may have concern these could be cancer.  HxSCC, BCC, atypical nevus. Patient does have a few areas of concern.  The following portions of the chart were reviewed this encounter and updated as appropriate: medications, allergies, medical history  Review of Systems:  No other skin or systemic complaints except as noted in HPI or Assessment and Plan.  Objective  Well appearing patient in no apparent distress; mood and affect are within normal limits.  A full examination was performed including scalp, head, eyes, ears, nose, lips, neck, chest, axillae, abdomen, back, buttocks, bilateral upper extremities, bilateral lower extremities, hands, feet, fingers, toes, fingernails, and toenails. All findings within normal limits unless otherwise noted below.   Relevant physical exam findings are noted in the Assessment and Plan.  R sup forehead x 1, R cheek x 2, L forehead x 1, L lat forehead x 1, L temple x 1, L dorsal hand x 1, R dorsal hand x 2, chest x 8 (17) Erythematous thin papules/macules with gritty scale.   frontal scalp x 1 Erythematous stuck-on, waxy papule or plaque  left anterior crus of antihelix White slightly scaly papule on anterior crus of antihelix 3mm       Assessment & Plan   SKIN CANCER SCREENING PERFORMED TODAY.  ACTINIC DAMAGE - Chronic condition, secondary to cumulative UV/sun exposure - diffuse scaly erythematous macules with underlying dyspigmentation - Recommend daily broad spectrum sunscreen SPF 30+ to sun-exposed areas, reapply every 2 hours as needed.  - Staying in the shade or wearing long sleeves, sun glasses (UVA+UVB  protection) and wide brim hats (4-inch brim around the entire circumference of the hat) are also recommended for sun protection.  - Call for new or changing lesions.  LENTIGINES, SEBORRHEIC KERATOSES, HEMANGIOMAS - Benign normal skin lesions - Benign-appearing - Call for any changes  MELANOCYTIC NEVI - Tan-brown and/or pink-flesh-colored symmetric macules and papules - Benign appearing on exam today - Observation - Call clinic for new or changing moles - Recommend daily use of broad spectrum spf 30+ sunscreen to sun-exposed areas.   HISTORY OF BASAL CELL CARCINOMA OF THE SKIN - No evidence of recurrence today - Recommend regular full body skin exams - Recommend daily broad spectrum sunscreen SPF 30+ to sun-exposed areas, reapply every 2 hours as needed.  - Call if any new or changing lesions are noted between office visits  History of Dysplastic Nevi - No evidence of recurrence today - Recommend regular full body skin exams - Recommend daily broad spectrum sunscreen SPF 30+ to sun-exposed areas, reapply every 2 hours as needed.  - Call if any new or changing lesions are noted between office visits  HISTORY OF SQUAMOUS CELL CARCINOMA OF THE SKIN - No evidence of recurrence today - Recommend regular full body skin exams - Recommend daily broad spectrum sunscreen SPF 30+ to sun-exposed areas, reapply every 2 hours as needed.  - Call if any new or changing lesions are noted between office visits  HISTORY OF ATYPICAL JUNCTIONAL LENTIGINOUS MELANOCYTIC PROLIFERATION, PERIPHERAL MARGIN INVOLVED, Cannot rule out early evolving lentigo maligna  Left cheek - biopsy 08/12/20, treated with Mohs 10/18/20 - No  evidence of recurrence today - Recommend regular full body skin exams - Recommend daily broad spectrum sunscreen SPF 30+ to sun-exposed areas, reapply every 2 hours as needed.  - Call if any new or changing lesions are noted between office visits  AK (actinic keratosis) (17) R sup  forehead x 1, R cheek x 2, L forehead x 1, L lat forehead x 1, L temple x 1, L dorsal hand x 1, R dorsal hand x 2, chest x 8  Actinic keratoses are precancerous spots that appear secondary to cumulative UV radiation exposure/sun exposure over time. They are chronic with expected duration over 1 year. A portion of actinic keratoses will progress to squamous cell carcinoma of the skin. It is not possible to reliably predict which spots will progress to skin cancer and so treatment is recommended to prevent development of skin cancer.  Recommend daily broad spectrum sunscreen SPF 30+ to sun-exposed areas, reapply every 2 hours as needed.  Recommend staying in the shade or wearing long sleeves, sun glasses (UVA+UVB protection) and wide brim hats (4-inch brim around the entire circumference of the hat). Call for new or changing lesions.  Patient has used 5FU at chest and lip prior and was miserable. Prefers not to treat with topical chemo.   Destruction of lesion - R sup forehead x 1, R cheek x 2, L forehead x 1, L lat forehead x 1, L temple x 1, L dorsal hand x 1, R dorsal hand x 2, chest x 8 (17)  Destruction method: cryotherapy   Informed consent: discussed and consent obtained   Lesion destroyed using liquid nitrogen: Yes   Cryotherapy cycles:  2 Outcome: patient tolerated procedure well with no complications   Post-procedure details: wound care instructions given    Related Medications fluorouracil (EFUDEX) 5 % cream Apply twice daily to lip for 7 days  Inflamed seborrheic keratosis frontal scalp x 1  Symptomatic, irritating, patient would like treated.  Benign-appearing.  Call clinic for new or changing lesions.    Destruction of lesion - frontal scalp x 1  Destruction method: cryotherapy   Informed consent: discussed and consent obtained   Lesion destroyed using liquid nitrogen: Yes   Cryotherapy cycles:  2 Outcome: patient tolerated procedure well with no complications    Post-procedure details: wound care instructions given    Neoplasm of uncertain behavior of skin left anterior crus of antihelix  Skin / nail biopsy Type of biopsy: tangential   Informed consent: discussed and consent obtained   Timeout: patient name, date of birth, surgical site, and procedure verified   Procedure prep:  Patient was prepped and draped in usual sterile fashion Prep type:  Isopropyl alcohol Anesthesia: the lesion was anesthetized in a standard fashion   Anesthetic:  1% lidocaine w/ epinephrine 1-100,000 buffered w/ 8.4% NaHCO3 Instrument used: DermaBlade   Hemostasis achieved with: pressure, aluminum chloride and electrodesiccation   Outcome: patient tolerated procedure well   Post-procedure details: sterile dressing applied and wound care instructions given   Dressing type: bandage and petrolatum    Specimen 1 - Surgical pathology Differential Diagnosis: chondrodermatitis nodularis helicis vs prurigo nodule vs calcinosis cutis vs BCC   Check Margins: No White slightly scaly papule on anterior crus of antihelix 3mm  Present for years per patient. Asymptomatic   Multiple benign nevi  Lentigines  Actinic elastosis  Seborrheic keratoses  Cherry angioma   Return in about 6 months (around 02/13/2024) for Hx Dysplastic Nevi, Hx BCC, Hx SCC, Hx  AK.  Anise Salvo, RMA, am acting as scribe for Elie Goody, MD .   Documentation: I have reviewed the above documentation for accuracy and completeness, and I agree with the above.  Elie Goody, MD

## 2023-08-21 ENCOUNTER — Other Ambulatory Visit: Payer: Self-pay

## 2023-08-23 LAB — SURGICAL PATHOLOGY

## 2023-09-03 ENCOUNTER — Telehealth: Payer: Self-pay | Admitting: Dermatology

## 2023-09-03 ENCOUNTER — Telehealth: Payer: Self-pay

## 2023-09-03 NOTE — Telephone Encounter (Signed)
Advised: 1. Skin, left anterior crus of antihelix, shave biopsy :       CHONDRODERMATITIS NODULARIS HELICIS    Please call to share diagnosis. Biopsy shows a chronic inflamed wound due to chronic pressure on the ear. If the wound persists after the skin heals, we recommend purchasing a donut pillow to offset the pressure on that ear while sleeping on that side. No skin cancer was seen and no further treatment is needed.  Patient voiced understanding.

## 2023-09-03 NOTE — Telephone Encounter (Signed)
Biopsy missed due to glitch in Epic       1. Skin, left anterior crus of antihelix, shave biopsy :       CHONDRODERMATITIS NODULARIS HELICIS   Please call to share diagnosis. Biopsy shows a chronic inflamed wound due to chronic pressure on the ear. If the wound persists after the skin heals, we recommend purchasing a donut pillow to offset the pressure on that ear while sleeping on that side. No skin cancer was seen and no further treatment is needed

## 2024-02-14 ENCOUNTER — Encounter: Payer: Self-pay | Admitting: Dermatology

## 2024-02-14 ENCOUNTER — Ambulatory Visit: Payer: TRICARE For Life (TFL) | Admitting: Dermatology

## 2024-02-14 DIAGNOSIS — L578 Other skin changes due to chronic exposure to nonionizing radiation: Secondary | ICD-10-CM | POA: Diagnosis not present

## 2024-02-14 DIAGNOSIS — Z85828 Personal history of other malignant neoplasm of skin: Secondary | ICD-10-CM

## 2024-02-14 DIAGNOSIS — D229 Melanocytic nevi, unspecified: Secondary | ICD-10-CM

## 2024-02-14 DIAGNOSIS — Z872 Personal history of diseases of the skin and subcutaneous tissue: Secondary | ICD-10-CM

## 2024-02-14 DIAGNOSIS — W908XXA Exposure to other nonionizing radiation, initial encounter: Secondary | ICD-10-CM

## 2024-02-14 DIAGNOSIS — D1801 Hemangioma of skin and subcutaneous tissue: Secondary | ICD-10-CM

## 2024-02-14 DIAGNOSIS — L111 Transient acantholytic dermatosis [Grover]: Secondary | ICD-10-CM

## 2024-02-14 DIAGNOSIS — L821 Other seborrheic keratosis: Secondary | ICD-10-CM

## 2024-02-14 DIAGNOSIS — L814 Other melanin hyperpigmentation: Secondary | ICD-10-CM | POA: Diagnosis not present

## 2024-02-14 DIAGNOSIS — Z1283 Encounter for screening for malignant neoplasm of skin: Secondary | ICD-10-CM

## 2024-02-14 DIAGNOSIS — L905 Scar conditions and fibrosis of skin: Secondary | ICD-10-CM

## 2024-02-14 NOTE — Patient Instructions (Addendum)
 For Grover's dermatitis: Use Triamcinolone 0.1% cream twice daily for up to 2 weeks as needed for rash. Avoid applying to face, groin, and axilla. Use as directed. Long-term use can cause thinning of the skin. Call for refills if needed.    Topical steroids (such as triamcinolone, fluocinolone, fluocinonide, mometasone, clobetasol, halobetasol, betamethasone, hydrocortisone) can cause thinning and lightening of the skin if they are used for too long in the same area. Your physician has selected the right strength medicine for your problem and area affected on the body. Please use your medication only as directed by your physician to prevent side effects.    Recommend OTC Gold Bond Rapid Relief Anti-Itch cream (pramoxine + menthol), CeraVe Anti-itch cream or lotion (pramoxine), Sarna lotion (Original- menthol + camphor or Sensitive- pramoxine) or Eucerin 12 hour Itch Relief lotion (menthol) up to 3 times per day to areas on body that are itchy.     Recommend daily broad spectrum sunscreen SPF 30+ to sun-exposed areas, reapply every 2 hours as needed. Call for new or changing lesions.  Staying in the shade or wearing long sleeves, sun glasses (UVA+UVB protection) and wide brim hats (4-inch brim around the entire circumference of the hat) are also recommended for sun protection.      Melanoma ABCDEs  Melanoma is the most dangerous type of skin cancer, and is the leading cause of death from skin disease.  You are more likely to develop melanoma if you: Have light-colored skin, light-colored eyes, or red or blond hair Spend a lot of time in the sun Tan regularly, either outdoors or in a tanning bed Have had blistering sunburns, especially during childhood Have a close family member who has had a melanoma Have atypical moles or large birthmarks  Early detection of melanoma is key since treatment is typically straightforward and cure rates are extremely high if we catch it early.   The first  sign of melanoma is often a change in a mole or a new dark spot.  The ABCDE system is a way of remembering the signs of melanoma.  A for asymmetry:  The two halves do not match. B for border:  The edges of the growth are irregular. C for color:  A mixture of colors are present instead of an even brown color. D for diameter:  Melanomas are usually (but not always) greater than 6mm - the size of a pencil eraser. E for evolution:  The spot keeps changing in size, shape, and color.  Please check your skin once per month between visits. You can use a small mirror in front and a large mirror behind you to keep an eye on the back side or your body.   If you see any new or changing lesions before your next follow-up, please call to schedule a visit.  Please continue daily skin protection including broad spectrum sunscreen SPF 30+ to sun-exposed areas, reapplying every 2 hours as needed when you're outdoors.   Staying in the shade or wearing long sleeves, sun glasses (UVA+UVB protection) and wide brim hats (4-inch brim around the entire circumference of the hat) are also recommended for sun protection.      Due to recent changes in healthcare laws, you may see results of your pathology and/or laboratory studies on MyChart before the doctors have had a chance to review them. We understand that in some cases there may be results that are confusing or concerning to you. Please understand that not all results are received at  the same time and often the doctors may need to interpret multiple results in order to provide you with the best plan of care or course of treatment. Therefore, we ask that you please give [place] 2 business days to thoroughly review all your results before contacting the office for clarification. Should we see a critical lab result, you will be contacted sooner.   If You Need Anything After Your Visit  If you have any questions or concerns for your doctor, please call our main line at  [phone] and press option 4 to reach your doctor's medical assistant. If no one answers, please leave a voicemail as directed and we will return your call as soon as possible. Messages left after 4 pm will be answered the following business day.   You may also send [place] a message via MyChart. We typically respond to MyChart messages within 1-2 business days.  For prescription refills, please ask your pharmacy to contact our office. Our fax number is [phone].  If you have an urgent issue when the clinic is closed that cannot wait until the next business day, you can page your doctor at the number below.    Please note that while we do our best to be available for urgent issues outside of office hours, we are not available 24/7.   If you have an urgent issue and are unable to reach [place], you may choose to seek medical care at your doctor's office, retail clinic, urgent care center, or emergency room.  If you have a medical emergency, please immediately call 911 or go to the emergency department.  Pager Numbers  - Dr. Gwen Pounds: [phone]  - Dr. Roseanne Reno: [phone]  - Dr. Katrinka Blazing: [phone]   In the event of inclement weather, please call our main line at [phone] for an update on the status of any delays or closures.  Dermatology Medication Tips: Please keep the boxes that topical medications come in in order to help keep track of the instructions about where and how to use these. Pharmacies typically print the medication instructions only on the boxes and not directly on the medication tubes.   If your medication is too expensive, please contact our office at [phone] option 4 or send [place] a message through MyChart.   We are unable to tell what your co-pay for medications will be in advance as this is different depending on your insurance coverage. However, we may be able to find a substitute medication at lower cost or fill out paperwork to get insurance to cover a needed  medication.   If a prior authorization is required to get your medication covered by your insurance company, please allow [place] 1-2 business days to complete this process.  Drug prices often vary depending on where the prescription is filled and some pharmacies may offer cheaper prices.  The website www.goodrx.com contains coupons for medications through different pharmacies. The prices here do not account for what the cost may be with help from insurance (it may be cheaper with your insurance), but the website can give you the price if you did not use any insurance.  - You can print the associated coupon and take it with your prescription to the pharmacy.  - You may also stop by our office during regular business hours and pick up a GoodRx coupon card.  - If you need your prescription sent electronically to a different pharmacy, notify our office through [facility] or by phone at [phone] option 4.  Si Usted Necesita Algo Despus de Su Visita  Tambin puede enviarnos un mensaje a travs de Clinical cytogeneticist. Por lo general respondemos a los mensajes de MyChart en el transcurso de 1 a 2 das hbiles.  Para renovar recetas, por favor pida a su farmacia que se ponga en contacto con nuestra oficina. Annie Sable de fax es [place] [phone].  Si tiene un asunto urgente cuando la clnica est cerrada y que no puede esperar hasta el siguiente da hbil, puede llamar/localizar a su doctor(a) al nmero que aparece a continuacin.   Por favor, tenga en cuenta que aunque hacemos todo lo posible para estar disponibles para asuntos urgentes fuera del horario de [place], no estamos disponibles las 24 horas del da, los 7 [address]  [address] de la [place].   Si tiene un problema urgente y no puede comunicarse con nosotros, puede optar por buscar atencin mdica  en el consultorio de su doctor(a), en una clnica privada, en un centro de atencin urgente o en una sala de emergencias.  Si tiene Engineer, drilling,  por favor llame inmediatamente al 911 o vaya a la sala de emergencias.  Nmeros de bper  - Dr. Gwen Pounds: [phone]  - Dra. Roseanne Reno: [phone]  - Dr. Katrinka Blazing: [phone]   En caso de inclemencias del tiempo, por favor llame a Lacy Duverney principal al [phone] para una actualizacin sobre el [place] de cualquier retraso o cierre.  Consejos para la medicacin en dermatologa: Por favor, guarde las cajas en las que vienen los medicamentos de uso tpico para ayudarle a seguir las instrucciones sobre dnde y cmo usarlos. Las farmacias generalmente imprimen las instrucciones del medicamento slo en las cajas y no directamente en los tubos del [place].   Si su medicamento es muy caro, por favor, pngase en contacto con Rolm Gala llamando al [phone] y presione la opcin 4 o envenos un mensaje a travs de Clinical cytogeneticist.   No podemos decirle cul ser su copago por los medicamentos por adelantado ya que esto es diferente dependiendo de la cobertura de su seguro. Sin embargo, es posible que podamos encontrar un medicamento sustituto a Audiological scientist un formulario para que el seguro cubra el medicamento que se considera necesario.   Si se requiere una autorizacin previa para que su compaa de seguros [place] su medicamento, por favor permtanos de 1 a 2 das hbiles para completar [address]  Los precios de los medicamentos varan con frecuencia dependiendo del Environmental consultant de dnde se surte la receta y alguna farmacias pueden ofrecer precios ms baratos.  El sitio web www.goodrx.com tiene cupones para medicamentos de Health and safety inspector. Los precios aqu no tienen en cuenta lo que podra costar con la ayuda del seguro (puede ser ms barato con su seguro), pero el sitio web puede darle el precio si no utiliz Tourist information centre manager.  - Puede imprimir el cupn correspondiente y llevarlo con su receta a la farmacia.  - Tambin puede pasar por nuestra oficina durante el horario de atencin  regular y Education officer, museum una tarjeta de cupones de GoodRx.  - Si necesita que su receta se enve electrnicamente a una farmacia diferente, informe a nuestra oficina a travs de MyChart de  o por telfono llamando al [phone] y presione la opcin 4.

## 2024-02-14 NOTE — Progress Notes (Signed)
 Follow-Up Visit   Subjective  Sean Harvey is a 68 y.o. male who presents for the following: Skin Cancer Screening and Full Body Skin Exam. Hx of AMP, HxSCC, HxBCC, HxAKs.   Check rash. Hx of Grover's. Has used Triamcinolone cream in the past. States it improves during the summer.   The patient presents for Total-Body Skin Exam (TBSE) for skin cancer screening and mole check. The patient has spots, moles and lesions to be evaluated, some may be new or changing and the patient may have concern these could be cancer.   The following portions of the chart were reviewed this encounter and updated as appropriate: medications, allergies, medical history  Review of Systems:  No other skin or systemic complaints except as noted in HPI or Assessment and Plan.  Objective  Well appearing patient in no apparent distress; mood and affect are within normal limits.  A full examination was performed including scalp, head, eyes, ears, nose, lips, neck, chest, axillae, abdomen, back, buttocks, bilateral upper extremities, bilateral lower extremities, hands, feet, fingers, toes, fingernails, and toenails. All findings within normal limits unless otherwise noted below.   Relevant physical exam findings are noted in the Assessment and Plan.     Assessment & Plan   SKIN CANCER SCREENING PERFORMED TODAY.  ACTINIC DAMAGE - Chronic condition, secondary to cumulative UV/sun exposure - diffuse scaly erythematous macules with underlying dyspigmentation - Recommend daily broad spectrum sunscreen SPF 30+ to sun-exposed areas, reapply every 2 hours as needed.  - Staying in the shade or wearing long sleeves, sun glasses (UVA+UVB protection) and wide brim hats (4-inch brim around the entire circumference of the hat) are also recommended for sun protection.  - Call for new or changing lesions.  LENTIGINES, SEBORRHEIC KERATOSES, HEMANGIOMAS - Benign normal skin lesions - Benign-appearing - Call for any  changes  MELANOCYTIC NEVI - Tan-brown and/or pink-flesh-colored symmetric macules and papules - Benign appearing on exam today - Observation - Call clinic for new or changing moles - Recommend daily use of broad spectrum spf 30+ sunscreen to sun-exposed areas.   HISTORY OF BASAL CELL CARCINOMA OF THE SKIN - No evidence of recurrence today - Recommend regular full body skin exams - Recommend daily broad spectrum sunscreen SPF 30+ to sun-exposed areas, reapply every 2 hours as needed.  - Call if any new or changing lesions are noted between office visits   HISTORY OF SQUAMOUS CELL CARCINOMA OF THE SKIN - No evidence of recurrence today - Recommend regular full body skin exams - Recommend daily broad spectrum sunscreen SPF 30+ to sun-exposed areas, reapply every 2 hours as needed.  - Call if any new or changing lesions are noted between office visits  HISTORY OF ATYPICAL JUNCTIONAL LENTIGINOUS MELANOCYTIC PROLIFERATION, PERIPHERAL MARGIN INVOLVED, Cannot rule out early evolving lentigo maligna  Left cheek - biopsy 08/12/20, treated with Mohs 10/18/20 - No evidence of recurrence today - Recommend regular full body skin exams - Recommend daily broad spectrum sunscreen SPF 30+ to sun-exposed areas, reapply every 2 hours as needed.  - Call if any new or changing lesions are noted between office visits  SCAR Exam: Dyspigmented smooth macule at L anterior crus of antihelix. Benign-appearing.  Observation.  Call clinic for new or changing lesions. Recommend daily broad spectrum sunscreen SPF 30+, reapply every 2 hours as needed. Treatment: Recommend Serica moisturizing scar formula cream every night or [facility] brand or Mederma silicone scar sheet every night for the first year after a scar appears to  help with scar remodeling if desired. Scars remodel on their own for a full year and will gradually improve in appearance over time.  Transient acantholytic dermatosis (grover)  Exam:  erythematous scaly papules at lower chest/upper abdomen.    Chronic and persistent condition with duration or expected duration over one year. Condition is bothersome/symptomatic for patient. Currently flared.   Plan Not bothering patient, so patient defers treatment   Topical steroids (such as triamcinolone, fluocinolone, fluocinonide, mometasone, clobetasol, halobetasol, betamethasone, hydrocortisone) can cause thinning and lightening of the skin if they are used for too long in the same area. Your physician has selected the right strength medicine for your problem and area affected on the body. Please use your medication only as directed by your physician to prevent side effects.    Recommend OTC Gold Bond Rapid Relief Anti-Itch cream (pramoxine + menthol), CeraVe Anti-itch cream or lotion (pramoxine), Sarna lotion (Original- menthol + camphor or Sensitive- pramoxine) or Eucerin 12 hour Itch Relief lotion (menthol) up to 3 times per day to areas on body that are itchy.   Reviewed no cure, only treatment when more bothersome MULTIPLE BENIGN NEVI   LENTIGINES   ACTINIC ELASTOSIS   SEBORRHEIC KERATOSES   CHERRY ANGIOMA   TRANSIENT ACANTHOLYTIC DERMATOSIS (GROVER)   Related Medications triamcinolone cream (KENALOG) 0.1 % Apply twice daily to affected areas of rash for up to 2 weeks. Avoid applying to face, groin, and axilla. Use as directed. Long-term use can cause thinning of the skin.  Return in about 6 months (around 08/16/2024) for TBSE, HxAMP, HxSCC, HxBCC, HxAK.  I, Lawson Radar, CMA, am acting as scribe for Elie Goody, MD.    Documentation: I have reviewed the above documentation for accuracy and completeness, and I agree with the above.  Elie Goody, MD

## 2024-08-18 ENCOUNTER — Encounter: Payer: Self-pay | Admitting: Dermatology

## 2024-08-18 ENCOUNTER — Ambulatory Visit (INDEPENDENT_AMBULATORY_CARE_PROVIDER_SITE_OTHER): Admitting: Dermatology

## 2024-08-18 DIAGNOSIS — Z85828 Personal history of other malignant neoplasm of skin: Secondary | ICD-10-CM

## 2024-08-18 DIAGNOSIS — D489 Neoplasm of uncertain behavior, unspecified: Secondary | ICD-10-CM

## 2024-08-18 DIAGNOSIS — L570 Actinic keratosis: Secondary | ICD-10-CM

## 2024-08-18 DIAGNOSIS — D485 Neoplasm of uncertain behavior of skin: Secondary | ICD-10-CM

## 2024-08-18 DIAGNOSIS — D229 Melanocytic nevi, unspecified: Secondary | ICD-10-CM

## 2024-08-18 DIAGNOSIS — L821 Other seborrheic keratosis: Secondary | ICD-10-CM | POA: Diagnosis not present

## 2024-08-18 DIAGNOSIS — L820 Inflamed seborrheic keratosis: Secondary | ICD-10-CM

## 2024-08-18 DIAGNOSIS — L578 Other skin changes due to chronic exposure to nonionizing radiation: Secondary | ICD-10-CM

## 2024-08-18 DIAGNOSIS — L814 Other melanin hyperpigmentation: Secondary | ICD-10-CM | POA: Diagnosis not present

## 2024-08-18 DIAGNOSIS — W908XXA Exposure to other nonionizing radiation, initial encounter: Secondary | ICD-10-CM | POA: Diagnosis not present

## 2024-08-18 DIAGNOSIS — D1801 Hemangioma of skin and subcutaneous tissue: Secondary | ICD-10-CM | POA: Diagnosis not present

## 2024-08-18 DIAGNOSIS — Z85820 Personal history of malignant melanoma of skin: Secondary | ICD-10-CM

## 2024-08-18 DIAGNOSIS — Z1283 Encounter for screening for malignant neoplasm of skin: Secondary | ICD-10-CM

## 2024-08-18 NOTE — Progress Notes (Signed)
 Follow-Up Visit   Subjective  Sean Harvey is a 68 y.o. male who presents for the following: Skin Cancer Screening and Full Body Skin Exam; hx of SCC and BCC. Patient reports areas of concern on his scalp, chest, right leg.   The patient presents for Total-Body Skin Exam (TBSE) for skin cancer screening and mole check. The patient has spots, moles and lesions to be evaluated, some may be new or changing and the patient may have concern these could be cancer.    The following portions of the chart were reviewed this encounter and updated as appropriate: medications, allergies, medical history  Review of Systems:  No other skin or systemic complaints except as noted in HPI or Assessment and Plan.  Objective  Well appearing patient in no apparent distress; mood and affect are within normal limits.  A full examination was performed including scalp, head, eyes, ears, nose, lips, neck, chest, axillae, abdomen, back, buttocks, bilateral upper extremities, bilateral lower extremities, hands, feet, fingers, toes, fingernails, and toenails. All findings within normal limits unless otherwise noted below.   Relevant physical exam findings are noted in the Assessment and Plan.  Right scapular back 5mm pink papule with scar-like features  Right Forearm - Anterior 1cm brown thin plaque  R chest x1, R medial thigh x1, L temple x1, R occipital parietal scalp x2 (5) Stuck on waxy paps with erythema  Assessment & Plan   SKIN CANCER SCREENING PERFORMED TODAY.  ACTINIC DAMAGE - Chronic condition, secondary to cumulative UV/sun exposure - diffuse scaly erythematous macules with underlying dyspigmentation - Recommend daily broad spectrum sunscreen SPF 30+ to sun-exposed areas, reapply every 2 hours as needed.  - Staying in the shade or wearing long sleeves, sun glasses (UVA+UVB protection) and wide brim hats (4-inch brim around the entire circumference of the hat) are also recommended for sun  protection.  - Call for new or changing lesions.  LENTIGINES, SEBORRHEIC KERATOSES, HEMANGIOMAS - Benign normal skin lesions - Benign-appearing - Call for any changes  MELANOCYTIC NEVI - Tan-brown and/or pink-flesh-colored symmetric macules and papules - Benign appearing on exam today - Observation - Call clinic for new or changing moles - Recommend daily use of broad spectrum spf 30+ sunscreen to sun-exposed areas.   HISTORY OF BASAL CELL CARCINOMA OF THE SKIN - No evidence of recurrence today - Recommend regular full body skin exams - Recommend daily broad spectrum sunscreen SPF 30+ to sun-exposed areas, reapply every 2 hours as needed.  - Call if any new or changing lesions are noted between office visits  HISTORY OF SQUAMOUS CELL CARCINOMA OF THE SKIN - No evidence of recurrence today - No lymphadenopathy - Recommend regular full body skin exams - Recommend daily broad spectrum sunscreen SPF 30+ to sun-exposed areas, reapply every 2 hours as needed.  - Call if any new or changing lesions are noted between office visits  HISTORY OF MELANOMA - No evidence of recurrence today - No lymphadenopathy, patient denied headaches - Recommend regular full body skin exams - Recommend daily broad spectrum sunscreen SPF 30+ to sun-exposed areas, reapply every 2 hours as needed.  - Call if any new or changing lesions are noted between office visits   ACTINIC KERATOSIS Exam: Erythematous thin papules/macules with gritty scale at the Bilateral forearms and distal upper arms  Actinic keratoses are precancerous spots that appear secondary to cumulative UV radiation exposure/sun exposure over time. They are chronic with expected duration over 1 year. A portion of actinic keratoses  will progress to squamous cell carcinoma of the skin. It is not possible to reliably predict which spots will progress to skin cancer and so treatment is recommended to prevent development of skin cancer.  Recommend  daily broad spectrum sunscreen SPF 30+ to sun-exposed areas, reapply every 2 hours as needed.  Recommend staying in the shade or wearing long sleeves, sun glasses (UVA+UVB protection) and wide brim hats (4-inch brim around the entire circumference of the hat). Call for new or changing lesions.  Treatment Plan: Discussed 5-FU field treatment as patient has used this in the past; patient deferred.   NEOPLASM OF UNCERTAIN BEHAVIOR (2) Right scapular back Skin / nail biopsy Type of biopsy: tangential   Informed consent: discussed and consent obtained   Timeout: patient name, date of birth, surgical site, and procedure verified   Procedure prep:  Patient was prepped and draped in usual sterile fashion Prep type:  Isopropyl alcohol Anesthesia: the lesion was anesthetized in a standard fashion   Anesthetic:  1% lidocaine w/ epinephrine 1-100,000 buffered w/ 8.4% NaHCO3 Instrument used: flexible razor blade   Hemostasis achieved with: pressure and aluminum chloride   Outcome: patient tolerated procedure well   Post-procedure details: sterile dressing applied and wound care instructions given   Dressing type: bandage and petrolatum   Additional details:  Petrolatum and a pressure bandage applied  Specimen 1 - Surgical pathology Differential Diagnosis: BCC vs ISK vs melanoma  Check Margins: No Right Forearm - Anterior Skin / nail biopsy Type of biopsy: tangential   Informed consent: discussed and consent obtained   Timeout: patient name, date of birth, surgical site, and procedure verified   Procedure prep:  Patient was prepped and draped in usual sterile fashion Prep type:  Isopropyl alcohol Anesthesia: the lesion was anesthetized in a standard fashion   Anesthetic:  1% lidocaine w/ epinephrine 1-100,000 buffered w/ 8.4% NaHCO3 Instrument used: DermaBlade   Hemostasis achieved with: pressure and aluminum chloride   Outcome: patient tolerated procedure well   Post-procedure details:  sterile dressing applied and wound care instructions given   Dressing type: bandage and petrolatum    Specimen 2 - Surgical pathology Differential Diagnosis: SK vs lentigo maligna vs lentigo  Check Margins: No INFLAMED SEBORRHEIC KERATOSIS (5) R chest x1, R medial thigh x1, L temple x1, R occipital parietal scalp x2 (5) Symptomatic, irritating, patient would like treated. Destruction of lesion - R chest x1, R medial thigh x1, L temple x1, R occipital parietal scalp x2 (5) Complexity: simple   Destruction method: cryotherapy   Informed consent: discussed and consent obtained   Timeout:  patient name, date of birth, surgical site, and procedure verified Lesion destroyed using liquid nitrogen: Yes   Region frozen until ice ball extended beyond lesion: Yes   Cryo cycles: 1 or 2. Outcome: patient tolerated procedure well with no complications   Post-procedure details: wound care instructions given   Additional details:  Prior to procedure, discussed risks of blister formation, small wound, skin dyspigmentation, or rare scar following cryotherapy. Recommend Vaseline ointment to treated areas while healing.   MULTIPLE BENIGN NEVI   LENTIGINES   ACTINIC ELASTOSIS   SEBORRHEIC KERATOSES   CHERRY ANGIOMA   ACTINIC KERATOSES   Return in about 6 months (around 02/15/2025) for TBSE.  I, Emerick Ege, CMA am acting as scribe for Boneta Sharps, MD.   Documentation: I have reviewed the above documentation for accuracy and completeness, and I agree with the above.  Boneta Sharps, MD

## 2024-08-18 NOTE — Patient Instructions (Addendum)
 Wound Care Instructions  Cleanse wound gently with soap and water once a day then pat dry with clean gauze. Apply a thin coat of Petrolatum (petroleum jelly, Vaseline) over the wound (unless you have an allergy to this). We recommend that you use a new, sterile tube of Vaseline. Do not pick or remove scabs. Do not remove the yellow or white healing tissue from the base of the wound.  Cover the wound with fresh, clean, nonstick gauze and secure with paper tape. You may use Band-Aids in place of gauze and tape if the wound is small enough, but would recommend trimming much of the tape off as there is often too much. Sometimes Band-Aids can irritate the skin.  You should call the office for your biopsy report after 1 week if you have not already been contacted.  If you experience any problems, such as abnormal amounts of bleeding, swelling, significant bruising, significant pain, or evidence of infection, please call the office immediately.  FOR ADULT SURGERY PATIENTS: If you need something for pain relief you may take 1 extra strength Tylenol  (acetaminophen ) AND 2 Ibuprofen  (200mg  each) together every 4 hours as needed for pain. (do not take these if you are allergic to them or if you have a reason you should not take them.) Typically, you may only need pain medication for 1 to 3 days.      Cryotherapy Aftercare  Wash gently with soap and water everyday.   Apply Vaseline and Band-Aid daily until healed.    Melanoma ABCDEs  Melanoma is the most dangerous type of skin cancer, and is the leading cause of death from skin disease.  You are more likely to develop melanoma if you: Have light-colored skin, light-colored eyes, or red or blond hair Spend a lot of time in the sun Tan regularly, either outdoors or in a tanning bed Have had blistering sunburns, especially during childhood Have a close family member who has had a melanoma Have atypical moles or large birthmarks  Early detection of  melanoma is key since treatment is typically straightforward and cure rates are extremely high if we catch it early.   The first sign of melanoma is often a change in a mole or a new dark spot.  The ABCDE system is a way of remembering the signs of melanoma.  A for asymmetry:  The two halves do not match. B for border:  The edges of the growth are irregular. C for color:  A mixture of colors are present instead of an even brown color. D for diameter:  Melanomas are usually (but not always) greater than 6mm - the size of a pencil eraser. E for evolution:  The spot keeps changing in size, shape, and color.  Please check your skin once per month between visits. You can use a small mirror in front and a large mirror behind you to keep an eye on the back side or your body.   If you see any new or changing lesions before your next follow-up, please call to schedule a visit.  Please continue daily skin protection including broad spectrum sunscreen SPF 30+ to sun-exposed areas, reapplying every 2 hours as needed when you're outdoors.     Recommend daily broad spectrum sunscreen SPF 30+ to sun-exposed areas, reapply every 2 hours as needed. Call for new or changing lesions.  Staying in the shade or wearing long sleeves, sun glasses (UVA+UVB protection) and wide brim hats (4-inch brim around the entire circumference of the  hat) are also recommended for sun protection.     Due to recent changes in healthcare laws, you may see results of your pathology and/or laboratory studies on MyChart before the doctors have had a chance to review them. We understand that in some cases there may be results that are confusing or concerning to you. Please understand that not all results are received at the same time and often the doctors may need to interpret multiple results in order to provide you with the best plan of care or course of treatment. Therefore, we ask that you please give us  2 business days to thoroughly  review all your results before contacting the office for clarification. Should we see a critical lab result, you will be contacted sooner.   If You Need Anything After Your Visit  If you have any questions or concerns for your doctor, please call our main line at [phone] and press option 4 to reach your doctor's medical assistant. If no one answers, please leave a voicemail as directed and we will return your call as soon as possible. Messages left after 4 pm will be answered the following business day.   You may also send us  a message via MyChart. We typically respond to MyChart messages within 1-2 business days.  For prescription refills, please ask your pharmacy to contact our office. Our fax number is [phone].  If you have an urgent issue when the clinic is closed that cannot wait until the next business day, you can page your doctor at the number below.    Please note that while we do our best to be available for urgent issues outside of office hours, we are not available 24/7.   If you have an urgent issue and are unable to reach us , you may choose to seek medical care at your doctor's office, retail clinic, urgent care center, or emergency room.  If you have a medical emergency, please immediately call 911 or go to the emergency department.  Pager Numbers  - Dr. Hester: [phone]  - Dr. Jackquline: [phone]  - Dr. Claudene: [phone]   - Dr. Raymund: [phone]  In the event of inclement weather, please call our main line at [phone] for an update on the status of any delays or closures.  Dermatology Medication Tips: Please keep the boxes that topical medications come in in order to help keep track of the instructions about where and how to use these. Pharmacies typically print the medication instructions only on the boxes and not directly on the medication tubes.   If your medication is too expensive, please contact our office at [phone] option 4 or  send us  a message through MyChart.   We are unable to tell what your co-pay for medications will be in advance as this is different depending on your insurance coverage. However, we may be able to find a substitute medication at lower cost or fill out paperwork to get insurance to cover a needed medication.   If a prior authorization is required to get your medication covered by your insurance company, please allow us  1-2 business days to complete this process.  Drug prices often vary depending on where the prescription is filled and some pharmacies may offer cheaper prices.  The website www.goodrx.com contains coupons for medications through different pharmacies. The prices here do not account for what the cost may be with help from insurance (it may be cheaper with your insurance), but the website can give you the price if  you did not use any insurance.  - You can print the associated coupon and take it with your prescription to the pharmacy.  - You may also stop by our office during regular business hours and pick up a GoodRx coupon card.  - If you need your prescription sent electronically to a different pharmacy, notify our office through [facility] or by phone at [phone] option 4.     Si Usted Necesita Algo Despus de Su Visita  Tambin puede enviarnos un mensaje a travs de Clinical cytogeneticist. Por lo general respondemos a los mensajes de MyChart en el transcurso de 1 a 2 das hbiles.  Para renovar recetas, por favor pida a su farmacia que se ponga en contacto con nuestra oficina. Randi lakes de fax es [place] [phone].  Si tiene un asunto urgente cuando la clnica est cerrada y que no puede esperar hasta el siguiente da hbil, puede llamar/localizar a su doctor(a) al nmero que aparece a continuacin.   Por favor, tenga en cuenta que aunque hacemos todo lo posible para estar disponibles para asuntos urgentes fuera del horario de [place], no estamos disponibles las 24 horas del  da, los 7 [address]  [address] de la [place].   Si tiene un problema urgente y no puede comunicarse con nosotros, puede optar por buscar atencin mdica  en el consultorio de su doctor(a), en una clnica privada, en un centro de atencin urgente o en una sala de emergencias.  Si tiene Engineer, drilling, por favor llame inmediatamente al 911 o vaya a la sala de emergencias.  Nmeros de bper  - Dr. Hester: [phone]  - Dra. Jackquline: [phone]  - Dr. Claudene: [phone]  - Dra. Kitts: [phone]  En caso de inclemencias del [place], por favor llame a nuestra lnea principal al [phone] para una actualizacin sobre el estado de cualquier retraso o cierre.  Consejos para la medicacin en dermatologa: Por favor, guarde las cajas en las que vienen los medicamentos de uso tpico para ayudarle a seguir las instrucciones sobre dnde y cmo usarlos. Las farmacias generalmente imprimen las instrucciones del medicamento slo en las cajas y no directamente en los tubos del [place].   Si su medicamento es muy caro, por favor, pngase en contacto con landry rieger llamando al [phone] y presione la opcin 4 o envenos un mensaje a travs de Clinical cytogeneticist.   No podemos decirle cul ser su copago por los medicamentos por adelantado ya que esto es diferente dependiendo de la cobertura de su seguro. Sin embargo, es posible que podamos encontrar un medicamento sustituto a Audiological scientist un formulario para que el seguro cubra el medicamento que se considera necesario.   Si se requiere una autorizacin previa para que su compaa de seguros malta su medicamento, por favor permtanos de 1 a 2 das hbiles para completar este proceso.  Los precios de los medicamentos varan con frecuencia dependiendo del Environmental consultant de dnde se surte la receta y alguna farmacias pueden ofrecer precios ms baratos.  El sitio web www.goodrx.com tiene cupones para medicamentos de Health and safety inspector. Los precios aqu no  tienen en cuenta lo que podra costar con la ayuda del seguro (puede ser ms barato con su seguro), pero el sitio web puede darle el precio si no utiliz Tourist information centre manager.  - Puede imprimir el cupn correspondiente y llevarlo con su receta a la farmacia.  - Tambin puede pasar por nuestra oficina durante el horario de atencin regular y Education officer, museum una tarjeta de cupones de GoodRx.  -  Si necesita que su receta se enve electrnicamente a Psychiatrist, informe a nuestra oficina a travs de MyChart de [place] o por telfono llamando al [phone] y presione la opcin 4.

## 2024-08-19 LAB — SURGICAL PATHOLOGY

## 2024-08-20 ENCOUNTER — Ambulatory Visit: Payer: Self-pay | Admitting: Dermatology

## 2024-08-20 NOTE — Telephone Encounter (Signed)
-----   Message from [facility] sent at 08/20/2024  9:59 AM EDT ----- Diagnosis: 1. Skin, right scapular back :       SEBORRHEIC KERATOSIS, INFLAMED        2. Skin, right forearm - anterior :       SOLAR LENTIGO AND EARLY SEBORRHEIC KERATOSIS   Plan: none, sent mychart message ----- Message ----- From: Interface, Lab In [place] Sent: 08/19/2024   5:44 PM EDT To: Boneta Sharps, MD

## 2024-08-20 NOTE — Telephone Encounter (Signed)
Discussed pathology results. Patient voiced understanding.

## 2025-02-16 ENCOUNTER — Ambulatory Visit: Admitting: Dermatology
